# Patient Record
Sex: Female | Born: 1965 | Race: White | Hispanic: No | Marital: Single | Smoking: Current every day smoker
Health system: Southern US, Community
[De-identification: ages and names within clinical notes are randomized; demographics above are authoritative.]

## PROBLEM LIST (undated history)

## (undated) DIAGNOSIS — J40 Bronchitis, not specified as acute or chronic: Secondary | ICD-10-CM

## (undated) DIAGNOSIS — F314 Bipolar disorder, current episode depressed, severe, without psychotic features: Secondary | ICD-10-CM

## (undated) DIAGNOSIS — Z87442 Personal history of urinary calculi: Secondary | ICD-10-CM

## (undated) DIAGNOSIS — M51369 Other intervertebral disc degeneration, lumbar region without mention of lumbar back pain or lower extremity pain: Secondary | ICD-10-CM

## (undated) DIAGNOSIS — K219 Gastro-esophageal reflux disease without esophagitis: Secondary | ICD-10-CM

## (undated) DIAGNOSIS — I10 Essential (primary) hypertension: Secondary | ICD-10-CM

## (undated) DIAGNOSIS — M5136 Other intervertebral disc degeneration, lumbar region: Secondary | ICD-10-CM

## (undated) DIAGNOSIS — K589 Irritable bowel syndrome without diarrhea: Secondary | ICD-10-CM

## (undated) DIAGNOSIS — R0600 Dyspnea, unspecified: Secondary | ICD-10-CM

## (undated) DIAGNOSIS — I059 Rheumatic mitral valve disease, unspecified: Secondary | ICD-10-CM

## (undated) DIAGNOSIS — F419 Anxiety disorder, unspecified: Secondary | ICD-10-CM

## (undated) HISTORY — PX: FRACTURE SURGERY: SHX138

## (undated) HISTORY — PX: APPENDECTOMY: SHX54

## (undated) HISTORY — PX: TUBAL LIGATION: SHX77

## (undated) HISTORY — PX: HYSTEROSCOPY: SHX211

---

## 2014-12-01 ENCOUNTER — Emergency Department: Admit: 2014-12-01 | Disposition: A | Discharge status: Home / Self Care | Payer: Self-pay | Admitting: Internal Medicine

## 2014-12-25 ENCOUNTER — Encounter: Payer: Self-pay | Admitting: Emergency Medicine

## 2014-12-25 ENCOUNTER — Emergency Department
Admission: EM | Admit: 2014-12-25 | Discharge: 2014-12-25 | Disposition: A | Discharge status: Home / Self Care | Payer: Self-pay | Attending: Internal Medicine | Admitting: Internal Medicine

## 2014-12-25 ENCOUNTER — Emergency Department: Payer: Self-pay

## 2014-12-25 DIAGNOSIS — Z880 Allergy status to penicillin: Secondary | ICD-10-CM | POA: Insufficient documentation

## 2014-12-25 DIAGNOSIS — Z720 Tobacco use: Secondary | ICD-10-CM | POA: Insufficient documentation

## 2014-12-25 DIAGNOSIS — J040 Acute laryngitis: Secondary | ICD-10-CM | POA: Insufficient documentation

## 2014-12-25 DIAGNOSIS — B349 Viral infection, unspecified: Secondary | ICD-10-CM | POA: Insufficient documentation

## 2014-12-25 HISTORY — DX: Rheumatic mitral valve disease, unspecified: I05.9

## 2014-12-25 LAB — POCT RAPID STREP A: Streptococcus, Group A Screen (Direct): NEGATIVE

## 2014-12-25 MED ORDER — PSEUDOEPH-BROMPHEN-DM 30-2-10 MG/5ML PO SYRP: 5.0000 mL | ORAL_SOLUTION | Freq: Four times a day (QID) | ORAL | Status: DC | PRN

## 2014-12-25 MED ORDER — PREDNISONE 10 MG (21) PO TBPK: 10.0000 mg | ORAL_TABLET | Freq: Every day | ORAL | Status: DC

## 2014-12-25 MED ORDER — TRAMADOL HCL 50 MG PO TABS
50.0000 mg | ORAL_TABLET | Freq: Once | ORAL | Status: AC
  Administered 2014-12-25: 50 mg via ORAL

## 2014-12-25 MED ORDER — TRAMADOL HCL 50 MG PO TABS
ORAL_TABLET | ORAL | Status: AC
  Administered 2014-12-25: 50 mg via ORAL
  Filled 2014-12-25: qty 1

## 2014-12-25 MED ORDER — PROMETHAZINE-CODEINE 6.25-10 MG/5ML PO SYRP: 5.0000 mL | ORAL_SOLUTION | Freq: Four times a day (QID) | ORAL | Status: DC | PRN

## 2014-12-25 NOTE — Discharge Instructions (Signed)
Cough, Adult   A cough is a reflex. It helps you clear your throat and airways. A cough can help heal your body. A cough can last 2 or 3 weeks (acute) or may last more than 8 weeks (chronic). Some common causes of a cough can include an infection, allergy, or a cold.  HOME CARE  · Only take medicine as told by your doctor.  · If given, take your medicines (antibiotics) as told. Finish them even if you start to feel better.  · Use a cold steam vaporizer or humidifier in your home. This can help loosen thick spit (secretions).  · Sleep so you are almost sitting up (semi-upright). Use pillows to do this. This helps reduce coughing.  · Rest as needed.  · Stop smoking if you smoke.  GET HELP RIGHT AWAY IF:  · You have yellowish-white fluid (pus) in your thick spit.  · Your cough gets worse.  · Your medicine does not reduce coughing, and you are losing sleep.  · You cough up blood.  · You have trouble breathing.  · Your pain gets worse and medicine does not help.  · You have a fever.  MAKE SURE YOU:   · Understand these instructions.  · Will watch your condition.  · Will get help right away if you are not doing well or get worse.  Document Released: 04/15/2011 Document Revised: 12/17/2013 Document Reviewed: 04/15/2011  Exit[employer]® Patient Information ©2015 [employer]. This information is not intended to replace advice given to you by your health care provider. Make sure you discuss any questions you have with your health care provider.

## 2014-12-25 NOTE — ED Notes (Signed)
Sore throat X 3 days; congestion, nonproductive cough and headache. Alert and oriented X4, pt in NAD, RR even and unlabored, color WNL.

## 2014-12-25 NOTE — ED Notes (Signed)
Pt alert and oriented X4, active, cooperative, pt in NAD. RR even and unlabored, color WNL.    

## 2014-12-25 NOTE — ED Notes (Signed)
Sore throat, cough ans hoarse voice.  No resp distress

## 2014-12-25 NOTE — ED Provider Notes (Signed)
[facility] Emergency Department Provider Note  ____________________________________________  Time seen: Approximately 3:46 PM  I have reviewed the triage vital signs and the nursing notes.   HISTORY  Chief Complaint Sore Throat    HPI Robin Conway is a 49 y.o. female planning of 3 days of sore throat nonproductive cough facial and chest congestion. Patient denies any fever but states she has chills.  Patient also states there is decreased forced volume.Patient rates the pain as a 6/10 days increasing with coughing. Patient denies any nausea vomiting diarrhea.   Past Medical History  Diagnosis Date  . Mitral valve disorder     There are no active problems to display for this patient.   History reviewed. No pertinent past surgical history.  Current Outpatient Rx  Name  Route  Sig  Dispense  Refill  . brompheniramine-pseudoephedrine-DM 30-2-10 MG/5ML syrup   Oral   Take 5 mLs by mouth 4 (four) times daily as needed.   120 mL   0   . predniSONE (STERAPRED UNI-PAK 21 TAB) 10 MG (21) TBPK tablet   Oral   Take 1 tablet (10 mg total) by mouth daily. Taper dose for 6 days.   21 tablet   0     Allergies Doxycycline; Propoxyphene; Sulfa antibiotics; and Penicillins  No family history on file.  Social History History  Substance Use Topics  . Smoking status: Current Every Day Smoker  . Smokeless tobacco: Not on file  . Alcohol Use: No    Review of Systems Constitutional: Patient denies fever but has chills Eyes: No visual changes. ENT: Patient states sore throat and decreased forced volume Cardiovascular: Chest pain secondary to cough. Respiratory: Denies shortness of breath. Gastrointestinal: No abdominal pain.  No nausea, no vomiting.  No diarrhea.  No constipation. Genitourinary: Negative for dysuria. Musculoskeletal: Negative for back pain. Skin: Negative for rash. Neurological: Negative for headaches, focal weakness or  numbness. Psychiatric: Endocrine: Hematological/Lymphatic: Allergic/Immunilogical:  10-point ROS otherwise negative.  ____________________________________________   PHYSICAL EXAM:  VITAL SIGNS: ED Triage Vitals  Enc Vitals Group     BP 12/25/14 1505 139/82 mmHg     Pulse Rate 12/25/14 1505 108     Resp 12/25/14 1505 22     Temp 12/25/14 1505 98.4 F (36.9 C)     Temp Source 12/25/14 1505 Oral     SpO2 12/25/14 1505 96 %     Weight 12/25/14 1505 203 lb (92.08 kg)     Height 12/25/14 1505 5\' 4"  (1.626 m)     Head Cir --      Peak Flow --      Pain Score 12/25/14 1505 6     Pain Loc --      Pain Edu? --      Excl. in GC? --     Constitutional: Alert and oriented. Well appearing and in no acute distress. Eyes: Conjunctivae are normal. PERRL. EOMI. Head: Atraumatic. Nose: Edematous nasal turbinates with thick nasal discharge. Mouth/Throat: Mucous membranes are moist.  Postnasal drainage and erythematous pharynx. Decreased forced volume. Neck: No stridor.   Hematological/Lymphatic/Immunilogical: No cervical lymphadenopathy. Cardiovascular: Normal rate, regular rhythm. Grossly normal heart sounds.  Good peripheral circulation. Respiratory: Normal respiratory effort.  No retractions. Lungs with upper lobe Rales Gastrointestinal: Soft and nontender. No distention. No abdominal bruits. No CVA tenderness. Genitourinary: Not examined Musculoskeletal: No lower extremity tenderness nor edema.  No joint effusions. Neurologic:  Normal speech and language. No gross focal neurologic deficits are  appreciated. Speech is normal. No gait instability. Skin:  Skin is warm, dry and intact. No rash noted. Psychiatric: Mood and affect are normal. Speech and behavior are normal.  ____________________________________________   LABS (all labs ordered are listed, but only abnormal results are displayed)  Labs Reviewed  POCT RAPID STREP A ([facility]RG CARE ONLY)    ____________________________________________  EKG   ____________________________________________  RADIOLOGY  No acute findings. ____________________________________________   PROCEDURES  Procedure(s) performed: None  Critical Care performed: No  ____________________________________________   INITIAL IMPRESSION / ASSESSMENT AND PLAN / ED COURSE  Pertinent labs & imaging results that were available during my care of the patient were reviewed by me and considered in my medical decision making (see chart for details).  Viral  illness ____________________________________________   FINAL CLINICAL IMPRESSION(S) / ED DIAGNOSES  Final diagnoses:  Viral illness  Acute laryngitis      Joni ReiningRonald K Smith, PA-C 12/25/14 1645  Sheryl Edwina BarthL Gottlieb, DO 12/25/14 2227

## 2014-12-25 NOTE — ED Notes (Signed)
Pt informed to return if any life threatening symptoms occur.  Pt left with family member who is driving her.

## 2014-12-28 LAB — CULTURE, GROUP A STREP (THRC)

## 2015-02-18 ENCOUNTER — Emergency Department: Payer: Self-pay

## 2015-02-18 ENCOUNTER — Emergency Department
Admission: EM | Admit: 2015-02-18 | Discharge: 2015-02-18 | Disposition: A | Discharge status: Home / Self Care | Payer: Self-pay | Attending: Emergency Medicine | Admitting: Emergency Medicine

## 2015-02-18 ENCOUNTER — Encounter: Payer: Self-pay | Admitting: Emergency Medicine

## 2015-02-18 DIAGNOSIS — Y998 Other external cause status: Secondary | ICD-10-CM | POA: Insufficient documentation

## 2015-02-18 DIAGNOSIS — M545 Low back pain, unspecified: Secondary | ICD-10-CM

## 2015-02-18 DIAGNOSIS — Y9389 Activity, other specified: Secondary | ICD-10-CM | POA: Insufficient documentation

## 2015-02-18 DIAGNOSIS — Z7952 Long term (current) use of systemic steroids: Secondary | ICD-10-CM | POA: Insufficient documentation

## 2015-02-18 DIAGNOSIS — Y9289 Other specified places as the place of occurrence of the external cause: Secondary | ICD-10-CM | POA: Insufficient documentation

## 2015-02-18 DIAGNOSIS — Z79899 Other long term (current) drug therapy: Secondary | ICD-10-CM | POA: Insufficient documentation

## 2015-02-18 DIAGNOSIS — M5137 Other intervertebral disc degeneration, lumbosacral region: Secondary | ICD-10-CM | POA: Insufficient documentation

## 2015-02-18 DIAGNOSIS — M47817 Spondylosis without myelopathy or radiculopathy, lumbosacral region: Secondary | ICD-10-CM

## 2015-02-18 DIAGNOSIS — W06XXXA Fall from bed, initial encounter: Secondary | ICD-10-CM | POA: Insufficient documentation

## 2015-02-18 DIAGNOSIS — Z720 Tobacco use: Secondary | ICD-10-CM | POA: Insufficient documentation

## 2015-02-18 DIAGNOSIS — Z880 Allergy status to penicillin: Secondary | ICD-10-CM | POA: Insufficient documentation

## 2015-02-18 MED ORDER — ORPHENADRINE CITRATE 30 MG/ML IJ SOLN
60.0000 mg | Freq: Two times a day (BID) | INTRAMUSCULAR | Status: DC
  Administered 2015-02-18: 60 mg via INTRAMUSCULAR

## 2015-02-18 MED ORDER — HYDROMORPHONE HCL 1 MG/ML IJ SOLN
1.0000 mg | Freq: Once | INTRAMUSCULAR | Status: AC
  Administered 2015-02-18: 1 mg via INTRAMUSCULAR

## 2015-02-18 MED ORDER — OXYCODONE-ACETAMINOPHEN 7.5-325 MG PO TABS: 1.0000 | ORAL_TABLET | Freq: Four times a day (QID) | ORAL | Status: DC | PRN

## 2015-02-18 MED ORDER — ORPHENADRINE CITRATE 30 MG/ML IJ SOLN
INTRAMUSCULAR | Status: AC
  Filled 2015-02-18: qty 2

## 2015-02-18 MED ORDER — HYDROMORPHONE HCL 1 MG/ML IJ SOLN
INTRAMUSCULAR | Status: AC
  Filled 2015-02-18: qty 1

## 2015-02-18 MED ORDER — METHOCARBAMOL 750 MG PO TABS: 1500.0000 mg | ORAL_TABLET | Freq: Four times a day (QID) | ORAL | Status: DC

## 2015-02-18 NOTE — ED Notes (Signed)
Patient states she was sharing twin bed with her niece. Larey SeatFell out of bed. Now having pain at lower back in middle. Injury happened on Saturday.

## 2015-02-18 NOTE — ED Provider Notes (Signed)
[facility] Emergency Department Provider Note  ____________________________________________  Time seen: Approximately 2:01 PM  I have reviewed the triage vital signs and the nursing notes.   HISTORY  Chief Complaint Fall and Back Pain    HPI Robin Conway is a 49 y.o. female patient complaining of low back pain secondary to a fall from a bed. Patient states she was certain at 20 bed for her knees and she rolled and fell out of Reglan on her back and also hit the back of her head. Patient denies any loss of consciousness is no vertical or vision disturbance since this incident 2 days ago. Patient stated back pain is increased and is and affect in her walking sitting and standing. Patient denies any radicular component to her back pain she denies any bladder or bowel dysfunction. Patient is rating the pain as a 10/70 describe the sharp.   Past Medical History  Diagnosis Date  . Mitral valve disorder     There are no active problems to display for this patient.   Past Surgical History  Procedure Laterality Date  . Tubal ligation      Current Outpatient Rx  Name  Route  Sig  Dispense  Refill  . methocarbamol (ROBAXIN-750) 750 MG tablet   Oral   Take 2 tablets (1,500 mg total) by mouth 4 (four) times daily.   40 tablet   0   . oxyCODONE-acetaminophen (PERCOCET) 7.5-325 MG per tablet   Oral   Take 1 tablet by mouth every 6 (six) hours as needed for severe pain.   12 tablet   0   . predniSONE (STERAPRED UNI-PAK 21 TAB) 10 MG (21) TBPK tablet   Oral   Take 1 tablet (10 mg total) by mouth daily. Taper dose for 6 days.   21 tablet   0   . promethazine-codeine (PHENERGAN WITH CODEINE) 6.25-10 MG/5ML syrup   Oral   Take 5 mLs by mouth every 6 (six) hours as needed for cough.   120 mL   0     Allergies Doxycycline; Propoxyphene; Sulfa antibiotics; and Penicillins  No family history on file.  Social History History  Substance Use Topics   . Smoking status: Current Every Day Smoker  . Smokeless tobacco: Not on file  . Alcohol Use: No    Review of Systems Constitutional: No fever/chills Eyes: No visual changes. ENT: No sore throat. Cardiovascular: Denies chest pain. Respiratory: Denies shortness of breath. Gastrointestinal: No abdominal pain.  No nausea, no vomiting.  No diarrhea.  No constipation. Genitourinary: Negative for dysuria. Musculoskeletal: Positive for back pain. Skin: Negative for rash. Neurological: Negative for headaches, focal weakness or numbness. Allergic/Immunilogical: See medication list 10-point ROS otherwise negative.  ____________________________________________   PHYSICAL EXAM:  VITAL SIGNS: ED Triage Vitals  Enc Vitals Group     BP 02/18/15 1311 152/88 mmHg     Pulse Rate 02/18/15 1311 102     Resp 02/18/15 1311 18     Temp 02/18/15 1311 98 F (36.7 C)     Temp Source 02/18/15 1311 Oral     SpO2 02/18/15 1311 96 %     Weight 02/18/15 1311 220 lb (99.791 kg)     Height 02/18/15 1311 5\' 4"  (1.626 m)     Head Cir --      Peak Flow --      Pain Score 02/18/15 1332 7     Pain Loc --      Pain Edu? --  Excl. in GC? --     Constitutional: Alert and oriented. Well appearing and in no acute distress. Eyes: Conjunctivae are normal. PERRL. EOMI. Head: Atraumatic. Healed scalp abrasion. Nose: No congestion/rhinnorhea. Mouth/Throat: Mucous membranes are moist.  Oropharynx non-erythematous. Neck: No stridor.  No cervical spine tenderness to palpation. Hematological/Lymphatic/Immunilogical: No cervical lymphadenopathy. Cardiovascular: Normal rate, regular rhythm. Grossly normal heart sounds.  Good peripheral circulation. Respiratory: Normal respiratory effort.  No retractions. Lungs CTAB. Gastrointestinal: Soft and nontender. No distention. No abdominal bruits. No CVA tenderness. Musculoskeletal: Obvious spinal deformity decreased range of motion in all fields patient sits to stand  her last upper extremity there is a small ecchymosis noticed inferior lumbar area Neurologic:  Normal speech and language. No gross focal neurologic deficits are appreciated. Speech is normal. No gait instability. Skin:  Skin is warm, dry and intact. No rash noted. Ecchymosis left buttocks. Psychiatric: Mood and affect are normal. Speech and behavior are normal.  ____________________________________________   LABS (all labs ordered are listed, but only abnormal results are displayed)  Labs Reviewed - No data to display ____________________________________________  EKG   ____________________________________________  RADIOLOGY arthritic changes but no acute findings.Marland KitchenMarland KitchenI, Joni Reining, personally viewed and evaluated these images as part of my medical decision making.   ____________________________________________   PROCEDURES  Procedure(s) performed: None  Critical Care performed: No  ____________________________________________   INITIAL IMPRESSION / ASSESSMENT AND PLAN / ED COURSE  Pertinent labs & imaging results that were available during my care of the patient were reviewed by me and considered in my medical decision making (see chart for details).  Low back pain secondary to fall. Discuss x-ray results with patient. He is to follow-up family doctor in 3-5 days if pain continues. Patient prescribed tramadol and Robaxin take as directed. ____________________________________________   FINAL CLINICAL IMPRESSION(S) / ED DIAGNOSES  Final diagnoses:  Back pain at L4-L5 level  DJD (degenerative joint disease), lumbosacral      Joni Reining, PA-C 02/18/15 1544  Loleta Rose, MD 02/22/15 1458

## 2015-03-22 ENCOUNTER — Emergency Department
Admission: EM | Admit: 2015-03-22 | Discharge: 2015-03-22 | Disposition: A | Discharge status: Home / Self Care | Payer: Self-pay | Attending: Emergency Medicine | Admitting: Emergency Medicine

## 2015-03-22 ENCOUNTER — Encounter: Payer: Self-pay | Admitting: Emergency Medicine

## 2015-03-22 DIAGNOSIS — Y9389 Activity, other specified: Secondary | ICD-10-CM | POA: Insufficient documentation

## 2015-03-22 DIAGNOSIS — Y998 Other external cause status: Secondary | ICD-10-CM | POA: Insufficient documentation

## 2015-03-22 DIAGNOSIS — Y9289 Other specified places as the place of occurrence of the external cause: Secondary | ICD-10-CM | POA: Insufficient documentation

## 2015-03-22 DIAGNOSIS — W1839XA Other fall on same level, initial encounter: Secondary | ICD-10-CM | POA: Insufficient documentation

## 2015-03-22 DIAGNOSIS — Z880 Allergy status to penicillin: Secondary | ICD-10-CM | POA: Insufficient documentation

## 2015-03-22 DIAGNOSIS — Z720 Tobacco use: Secondary | ICD-10-CM | POA: Insufficient documentation

## 2015-03-22 DIAGNOSIS — M5442 Lumbago with sciatica, left side: Secondary | ICD-10-CM | POA: Insufficient documentation

## 2015-03-22 DIAGNOSIS — Z7952 Long term (current) use of systemic steroids: Secondary | ICD-10-CM | POA: Insufficient documentation

## 2015-03-22 HISTORY — DX: Other intervertebral disc degeneration, lumbar region without mention of lumbar back pain or lower extremity pain: M51.369

## 2015-03-22 HISTORY — DX: Other intervertebral disc degeneration, lumbar region: M51.36

## 2015-03-22 MED ORDER — OXYCODONE-ACETAMINOPHEN 5-325 MG PO TABS: 1.0000 | ORAL_TABLET | Freq: Four times a day (QID) | ORAL | Status: DC | PRN

## 2015-03-22 MED ORDER — HYDROMORPHONE HCL 1 MG/ML IJ SOLN
1.0000 mg | Freq: Once | INTRAMUSCULAR | Status: AC
  Administered 2015-03-22: 1 mg via INTRAMUSCULAR
  Filled 2015-03-22: qty 1

## 2015-03-22 MED ORDER — PREDNISONE 10 MG (21) PO TBPK: ORAL_TABLET | ORAL | Status: DC

## 2015-03-22 NOTE — ED Notes (Signed)
Robin Conway who states he is the pts brother in law and will give the pt a ride home after getting this medication.

## 2015-03-22 NOTE — ED Notes (Addendum)
Pt says she fell last night going up a ramp into someone's house; history of degenerative disc disease; increased pain since fall; lumbar pain; also mild headache since yesterday to frontal lobe; says she's sure she did not hit her head yesterday

## 2015-03-22 NOTE — Discharge Instructions (Signed)
Back Pain, Adult °Back pain is very common. The pain often gets better over time. The cause of back pain is usually not dangerous. Most people can learn to manage their back pain on their own.  °HOME CARE  °· Stay active. Start with short walks on flat ground if you can. Try to walk farther each day. °· Do not sit, drive, or stand in one place for more than 30 minutes. Do not stay in bed. °· Do not avoid exercise or work. Activity can help your back heal faster. °· Be careful when you bend or lift an object. Bend at your knees, keep the object close to you, and do not twist. °· Sleep on a firm mattress. Lie on your side, and bend your knees. If you lie on your back, put a pillow under your knees. °· Only take medicines as told by your doctor. °· Put ice on the injured area. °¨ Put ice in a plastic bag. °¨ Place a towel between your skin and the bag. °¨ Leave the ice on for 15-20 minutes, 03-04 times a day for the first 2 to 3 days. After that, you can switch between ice and heat packs. °· Ask your doctor about back exercises or massage. °· Avoid feeling anxious or stressed. Find good ways to deal with stress, such as exercise. °GET HELP RIGHT AWAY IF:  °· Your pain does not go away with rest or medicine. °· Your pain does not go away in 1 week. °· You have new problems. °· You do not feel well. °· The pain spreads into your legs. °· You cannot control when you poop (bowel movement) or pee (urinate). °· Your arms or legs feel weak or lose feeling (numbness). °· You feel sick to your stomach (nauseous) or throw up (vomit). °· You have belly (abdominal) pain. °· You feel like you may pass out (faint). °MAKE SURE YOU:  °· Understand these instructions. °· Will watch your condition. °· Will get help right away if you are not doing well or get worse. °Document Released: 01/19/2008 Document Revised: 10/25/2011 Document Reviewed: 12/04/2013 °[employer]® Patient Information ©2015 [employer]. This information is not intended  to replace advice given to you by your health care provider. Make sure you discuss any questions you have with your health care provider. ° °Back Exercises °Back exercises help treat and prevent back injuries. The goal is to increase your strength in your belly (abdominal) and back muscles. These exercises can also help with flexibility. Start these exercises when told by your doctor. °HOME CARE °Back exercises include: °Pelvic Tilt. °· Lie on your back with your knees bent. Tilt your pelvis until the lower part of your back is against the floor. Hold this position 5 to 10 sec. Repeat this exercise 5 to 10 times. °Knee to Chest. °· Pull 1 knee up against your chest and hold for 20 to 30 seconds. Repeat this with the other knee. This may be done with the other leg straight or bent, whichever feels better. Then, pull both knees up against your chest. °Sit-Ups or Curl-Ups. °· Bend your knees 90 degrees. Start with tilting your pelvis, and do a partial, slow sit-up. Only lift your upper half 30 to 45 degrees off the floor. Take at least 2 to 3 seonds for each sit-up. Do not do sit-ups with your knees out straight. If partial sit-ups are difficult, simply do the above but with only tightening your belly (abdominal) muscles and holding it as   told. Hip-Lift.  Lie on your back with your knees flexed 90 degrees. Push down with your feet and shoulders as you raise your hips 2 inches off the floor. Hold for 10 seconds, repeat 5 to 10 times. Back Arches.  Lie on your stomach. Prop yourself up on bent elbows. Slowly press on your hands, causing an arch in your low back. Repeat 3 to 5 times. Shoulder-Lifts.  Lie face down with arms beside your body. Keep hips and belly pressed to floor as you slowly lift your head and shoulders off the floor. Do not overdo your exercises. Be careful in the beginning. Exercises may cause you some mild back discomfort. If the pain lasts for more than 15 minutes, stop the exercises until  you see your doctor. Improvement with exercise for back problems is slow.  Document Released: 09/04/2010 Document Revised: 10/25/2011 Document Reviewed: 06/03/2011 [facility] Patient Information 2015 [place], [place]. This information is not intended to replace advice given to you by your health care provider. Make sure you discuss any questions you have with your health care provider.    Take pain medicine as directed. Follow-up with your physician or the orthopedist for further evaluation. When pain improves, begin exercises.

## 2015-03-22 NOTE — ED Notes (Signed)
Pt states she is here due to falling yesterday and states she tried heat and ice, motrin and nothing helps pt states she has degenerative disk disease and thinks the fall caused the flair up in pain.

## 2015-03-22 NOTE — ED Notes (Signed)
Patient to be driven home by Randol Kern.

## 2015-03-22 NOTE — ED Provider Notes (Signed)
[facility] Emergency Department Provider Note  ____________________________________________  Time seen: Approximately 9:43 PM  I have reviewed the triage vital signs and the nursing notes.   HISTORY  Chief Complaint Back Pain    HPI SOPHINA MITTEN is a 49 y.o. female with history of degenerative disc disease who fell yesterday morning, injuring her lower back. She has radiation down the left leg. She was seen for similar complaint one month ago. She has followed up with her physician, Edmonia James and has plans to see orthopedist. She does take Soma occasionally for pain. Pain is worse with movement. X-rays last month were stable. No abdominal pain. Occasional nausea when pain is severe.    Past Medical History  Diagnosis Date  . Mitral valve disorder   . Degenerative disc disease, lumbar     There are no active problems to display for this patient.   Past Surgical History  Procedure Laterality Date  . Tubal ligation      Current Outpatient Rx  Name  Route  Sig  Dispense  Refill  . methocarbamol (ROBAXIN-750) 750 MG tablet   Oral   Take 2 tablets (1,500 mg total) by mouth 4 (four) times daily.   40 tablet   0   . oxyCODONE-acetaminophen (ROXICET) 5-325 MG per tablet   Oral   Take 1 tablet by mouth every 6 (six) hours as needed.   20 tablet   0   . predniSONE (STERAPRED UNI-PAK 21 TAB) 10 MG (21) TBPK tablet      6 tablets on day 1, 5 tablets on day 2, 4 tablets on day 3, etc...   21 tablet   0   . promethazine-codeine (PHENERGAN WITH CODEINE) 6.25-10 MG/5ML syrup   Oral   Take 5 mLs by mouth every 6 (six) hours as needed for cough.   120 mL   0     Allergies Doxycycline; Propoxyphene; Sulfa antibiotics; and Penicillins  History reviewed. No pertinent family history.  Social History History  Substance Use Topics  . Smoking status: Current Every Day Smoker  . Smokeless tobacco: Not on file  . Alcohol Use: No    Review  of Systems Constitutional: No fever/chills Eyes: No visual changes. ENT: No sore throat. Cardiovascular: Denies chest pain. Respiratory: Denies shortness of breath. Gastrointestinal: No abdominal pain.  No diarrhea.  No constipation. Genitourinary: Negative for dysuria. Musculoskeletal: See above Skin: Negative for rash. Neurological: Negative for headaches, focal weakness or numbness.  10-point ROS otherwise negative.  ____________________________________________   PHYSICAL EXAM:  VITAL SIGNS: ED Triage Vitals  Enc Vitals Group     BP 03/22/15 1934 125/90 mmHg     Pulse Rate 03/22/15 1934 104     Resp 03/22/15 1934 20     Temp 03/22/15 1934 97.9 F (36.6 C)     Temp Source 03/22/15 1934 Oral     SpO2 --      Weight 03/22/15 1934 225 lb (102.059 kg)     Height 03/22/15 1934 5\' 4"  (1.626 m)     Head Cir --      Peak Flow --      Pain Score 03/22/15 1935 7     Pain Loc --      Pain Edu? --      Excl. in GC? --     Constitutional: Alert and oriented. Well appearing and in moderate acute distress. Eyes: Conjunctivae are normal. EOMI. Head: Atraumatic. Nose: No congestion/rhinnorhea. Mouth/Throat: Mucous membranes are  moist.  Oropharynx non-erythematous. Neck: No stridor.  Cardiovascular: Normal rate, regular rhythm. Grossly normal heart sounds.  Good peripheral circulation. Respiratory: Normal respiratory effort.  No retractions. Lungs CTAB. Gastrointestinal: Soft and nontender. No distention. No abdominal bruits. No CVA tenderness. Musculoskeletal:    tender over the lumbar spine and paraspinal muscles.  rom intact.  positive SLR on left.  Neg on right.   nontender over the greater trochanter bilateral.  Neurologic:  Normal speech and language. No gross focal neurologic deficits are appreciated. No gait instability. Skin:  Skin is warm, dry and intact. No rash noted. Psychiatric: Mood and affect are normal. Speech and behavior are  normal.  ____________________________________________   LABS (all labs ordered are listed, but only abnormal results are displayed)  Labs Reviewed - No data to display ____________________________________________  EKG   ____________________________________________  RADIOLOGY  Offered repeat xray but patient decline ____________________________________________   PROCEDURES  Procedure(s) performed: None  Critical Care performed: No  ____________________________________________   INITIAL IMPRESSION / ASSESSMENT AND PLAN / ED COURSE  Pertinent labs & imaging results that were available during my care of the patient were reviewed by me and considered in my medical decision making (see chart for details).  49 year old female with known history of degenerative disc disease who presents with new worsening lower back pain with radiation down the left leg, since a fall yesterday. She fell going up a ramp. Pain is localized to the lower back and radiates down the left leg. She did walk in here. Offered repeat x-rays but patient declines. She is given an injection for pain control. She will take prednisone taper, and Percocet as needed. She has follow-up with her physician, Gwinda Passe who has planned to refer to orthopedist. If needed she can contact Dr. Martha Clan for follow-up. ____________________________________________   FINAL CLINICAL IMPRESSION(S) / ED DIAGNOSES  Final diagnoses:  Midline low back pain with left-sided sciatica      Ignacia Bayley, PA-C 03/22/15 2150  Sharman Cheek, MD 03/22/15 [phone]

## 2015-05-12 ENCOUNTER — Emergency Department: Payer: Self-pay

## 2015-05-12 ENCOUNTER — Emergency Department
Admission: EM | Admit: 2015-05-12 | Discharge: 2015-05-12 | Disposition: A | Discharge status: Home / Self Care | Payer: Self-pay | Attending: Emergency Medicine | Admitting: Emergency Medicine

## 2015-05-12 ENCOUNTER — Encounter: Payer: Self-pay | Admitting: Emergency Medicine

## 2015-05-12 DIAGNOSIS — Z720 Tobacco use: Secondary | ICD-10-CM | POA: Insufficient documentation

## 2015-05-12 DIAGNOSIS — M545 Low back pain, unspecified: Secondary | ICD-10-CM

## 2015-05-12 DIAGNOSIS — Y998 Other external cause status: Secondary | ICD-10-CM | POA: Insufficient documentation

## 2015-05-12 DIAGNOSIS — G8929 Other chronic pain: Secondary | ICD-10-CM | POA: Insufficient documentation

## 2015-05-12 DIAGNOSIS — Z880 Allergy status to penicillin: Secondary | ICD-10-CM | POA: Insufficient documentation

## 2015-05-12 DIAGNOSIS — W1839XA Other fall on same level, initial encounter: Secondary | ICD-10-CM | POA: Insufficient documentation

## 2015-05-12 DIAGNOSIS — S3992XA Unspecified injury of lower back, initial encounter: Secondary | ICD-10-CM | POA: Insufficient documentation

## 2015-05-12 DIAGNOSIS — Y9389 Activity, other specified: Secondary | ICD-10-CM | POA: Insufficient documentation

## 2015-05-12 DIAGNOSIS — Y9289 Other specified places as the place of occurrence of the external cause: Secondary | ICD-10-CM | POA: Insufficient documentation

## 2015-05-12 DIAGNOSIS — M5136 Other intervertebral disc degeneration, lumbar region: Secondary | ICD-10-CM | POA: Insufficient documentation

## 2015-05-12 MED ORDER — ORPHENADRINE CITRATE 30 MG/ML IJ SOLN
60.0000 mg | Freq: Two times a day (BID) | INTRAMUSCULAR | Status: DC
  Administered 2015-05-12: 60 mg via INTRAMUSCULAR
  Filled 2015-05-12: qty 2

## 2015-05-12 MED ORDER — HYDROMORPHONE HCL 1 MG/ML IJ SOLN
1.0000 mg | Freq: Once | INTRAMUSCULAR | Status: AC
  Administered 2015-05-12: 1 mg via INTRAMUSCULAR
  Filled 2015-05-12: qty 1

## 2015-05-12 MED ORDER — KETOROLAC TROMETHAMINE 60 MG/2ML IM SOLN
60.0000 mg | Freq: Once | INTRAMUSCULAR | Status: AC
  Administered 2015-05-12: 60 mg via INTRAMUSCULAR
  Filled 2015-05-12: qty 2

## 2015-05-12 NOTE — ED Provider Notes (Signed)
[facility] Emergency Department Provider Note  ____________________________________________  Time seen: Approximately 2:46 PM  I have reviewed the triage vital signs and the nursing notes.   HISTORY  Chief Complaint Back Pain    HPI Robin Conway is a 49 y.o. female is complaining of low back pain secondary to a fall. Patient fell backwards landing on her buttocks and then fell on her right hip. 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 Patient denies any bladder or bowel dysfunction. Patient denies any radicular component to this pain. No palliative measures taken for this complaint. Patient rated her pain as 8/10 described as sharp.  Past Medical History  Diagnosis Date  . Mitral valve disorder   . Degenerative disc disease, lumbar     There are no active problems to display for this patient.   Past Surgical History  Procedure Laterality Date  . Tubal ligation      Current Outpatient Rx  Name  Route  Sig  Dispense  Refill  . methocarbamol (ROBAXIN-750) 750 MG tablet   Oral   Take 2 tablets (1,500 mg total) by mouth 4 (four) times daily.   40 tablet   0   . oxyCODONE-acetaminophen (ROXICET) 5-325 MG per tablet   Oral   Take 1 tablet by mouth every 6 (six) hours as needed.   20 tablet   0   . predniSONE (STERAPRED UNI-PAK 21 TAB) 10 MG (21) TBPK tablet      6 tablets on day 1, 5 tablets on day 2, 4 tablets on day 3, etc...   21 tablet   0   . promethazine-codeine (PHENERGAN WITH CODEINE) 6.25-10 MG/5ML syrup   Oral   Take 5 mLs by mouth every 6 (six) hours as needed for cough.   120 mL   0     Allergies Doxycycline; Propoxyphene; Sulfa antibiotics; and Penicillins  No family history on file.  Social History Social History  Substance Use Topics  . Smoking status: Current Every Day Smoker  . Smokeless tobacco: None  . Alcohol Use: No    Review of Systems Constitutional: No  fever/chills Eyes: No visual changes. ENT: No sore throat. Cardiovascular: Denies chest pain. Respiratory: Denies shortness of breath. Gastrointestinal: No abdominal pain.  No nausea, no vomiting.  No diarrhea.  No constipation. Genitourinary: Negative for dysuria. Musculoskeletal: Chronic back pain  Skin: Negative for rash. Neurological: Negative for headaches, focal weakness or numbness. Allergic/Immunilogical: See medication list  10-point ROS otherwise negative.  ____________________________________________   PHYSICAL EXAM:  VITAL SIGNS: ED Triage Vitals  Enc Vitals Group     BP --      Pulse --      Resp --      Temp --      Temp src --      SpO2 --      Weight 05/12/15 1444 200 lb (90.719 kg)     Height 05/12/15 1444  (1.626 m)     Head Cir --      Peak Flow --      Pain Score 05/12/15 1444 8     Pain Loc --      Pain Edu? --      Excl. in GC? --     Constitutional: Alert and oriented. Well appearing and in no acute distress. Eyes: Conjunctivae are normal. PERRL. EOMI. Head: Atraumatic. Nose: No congestion/rhinnorhea. Mouth/Throat: Mucous membranes are moist.  Oropharynx non-erythematous.  Neck: No stridor.   Hematological/Lymphatic/Immunilogical: No cervical lymphadenopathy. Cardiovascular: Normal rate, regular rhythm. Grossly normal heart sounds.  Good peripheral circulation. Respiratory: Normal respiratory effort.  No retractions. Lungs CTAB. Gastrointestinal: Soft and nontender. No distention. No abdominal bruits. No CVA tenderness. Musculoskeletal: No lower extremity tenderness nor edema.  No joint effusions. Neurologic:  Normal speech and language. No gross focal neurologic deficits are appreciated. No gait instability. Skin:  Skin is warm, dry and intact. No rash noted. Psychiatric: Mood and affect are normal. Speech and behavior are normal.  ____________________________________________   LABS (all labs ordered are listed, but only abnormal  results are displayed)  Labs Reviewed - No data to display ____________________________________________  EKG   ____________________________________________  RADIOLOGY  No acute findings on lumbar x-ray. Dedicated facet changes at L5-S1 as noticed on previous exam.  I, Joni Reining, personally viewed and evaluated these images (plain radiographs) as part of my medical decision making.   ____________________________________________   PROCEDURES  Procedure(s) performed: None  Critical Care performed: No  ____________________________________________   INITIAL IMPRESSION / ASSESSMENT AND PLAN / ED COURSE  Pertinent labs & imaging results that were available during my care of the patient were reviewed by me and considered in my medical decision making (see chart for details). Acute low back pain. Discussed x-ray findings with patient. Patient given prescription for Mobic and tramadol. Patient advised to wear elastic lumbar support while at work. Patient advised [facility]. ____________________________________________   FINAL CLINICAL IMPRESSION(S) / ED DIAGNOSES  Final diagnoses:  Back pain at L4-L5 level      Joni Reining, PA-C 05/12/15 1606  Emily Filbert, MD 05/13/15 [phone]

## 2015-05-12 NOTE — ED Notes (Signed)
States she fell backwards today  Having lower back pain

## 2015-05-12 NOTE — Discharge Instructions (Signed)
Advised elastic lumbar support

## 2015-05-12 NOTE — ED Notes (Signed)
States she fell backwards this am   Having severe back pain  Ambulates  Slowly d/t pain

## 2015-07-11 ENCOUNTER — Emergency Department: Payer: Self-pay

## 2015-07-11 ENCOUNTER — Emergency Department
Admission: EM | Admit: 2015-07-11 | Discharge: 2015-07-11 | Disposition: A | Discharge status: Home / Self Care | Payer: Self-pay | Attending: Emergency Medicine | Admitting: Emergency Medicine

## 2015-07-11 DIAGNOSIS — I10 Essential (primary) hypertension: Secondary | ICD-10-CM | POA: Insufficient documentation

## 2015-07-11 DIAGNOSIS — F17210 Nicotine dependence, cigarettes, uncomplicated: Secondary | ICD-10-CM | POA: Insufficient documentation

## 2015-07-11 DIAGNOSIS — J40 Bronchitis, not specified as acute or chronic: Secondary | ICD-10-CM | POA: Insufficient documentation

## 2015-07-11 DIAGNOSIS — Z880 Allergy status to penicillin: Secondary | ICD-10-CM | POA: Insufficient documentation

## 2015-07-11 DIAGNOSIS — E119 Type 2 diabetes mellitus without complications: Secondary | ICD-10-CM | POA: Insufficient documentation

## 2015-07-11 HISTORY — DX: Bipolar disorder, current episode depressed, severe, without psychotic features: F31.4

## 2015-07-11 HISTORY — DX: Essential (primary) hypertension: I10

## 2015-07-11 LAB — POCT RAPID STREP A: Streptococcus, Group A Screen (Direct): NEGATIVE

## 2015-07-11 MED ORDER — PREDNISONE 20 MG PO TABS
60.0000 mg | ORAL_TABLET | Freq: Once | ORAL | Status: AC
  Administered 2015-07-11: 60 mg via ORAL
  Filled 2015-07-11: qty 3

## 2015-07-11 MED ORDER — HYDROCOD POLST-CPM POLST ER 10-8 MG/5ML PO SUER: 5.0000 mL | Freq: Two times a day (BID) | ORAL | Status: DC

## 2015-07-11 MED ORDER — PREDNISONE 10 MG (21) PO TBPK: 10.0000 mg | ORAL_TABLET | Freq: Every day | ORAL | Status: DC

## 2015-07-11 MED ORDER — IPRATROPIUM-ALBUTEROL 0.5-2.5 (3) MG/3ML IN SOLN
3.0000 mL | Freq: Once | RESPIRATORY_TRACT | Status: AC
  Administered 2015-07-11: 3 mL via RESPIRATORY_TRACT
  Filled 2015-07-11: qty 3

## 2015-07-11 MED ORDER — ALBUTEROL SULFATE HFA 108 (90 BASE) MCG/ACT IN AERS: 2.0000 | INHALATION_SPRAY | Freq: Four times a day (QID) | RESPIRATORY_TRACT | Status: DC | PRN

## 2015-07-11 NOTE — Discharge Instructions (Signed)

## 2015-07-11 NOTE — ED Notes (Signed)
Pt c/o cough with congestion for the past 3-4 days.. States she has been taking OTC meds without relief.

## 2015-07-11 NOTE — ED Provider Notes (Signed)
[facility] Emergency Department Provider Note     Time seen: ----------------------------------------- 2:07 PM on 07/11/2015 -----------------------------------------    I have reviewed the triage vital signs and the nursing notes.   HISTORY  Chief Complaint URI    HPI Robin Conway is a 49 y.o. female who presents ER with cough congestion for 3-4 days. Patient states been taking over-the-counter medications without any relief.Patient complains of persistent productive cough, mild sore throat and weakness. Patient does not have history of asthma or COPD.   Past Medical History  Diagnosis Date  . Mitral valve disorder   . Degenerative disc disease, lumbar   . Hypertension   . Diabetes mellitus without complication (HCC)   . Bipolar 1 disorder, depressed, severe (HCC)     There are no active problems to display for this patient.   Past Surgical History  Procedure Laterality Date  . Tubal ligation    . Fracture surgery      Allergies Doxycycline; Propoxyphene; Sulfa antibiotics; and Penicillins  Social History Social History  Substance Use Topics  . Smoking status: Current Every Day Smoker    Types: Cigarettes  . Smokeless tobacco: None  . Alcohol Use: No    Review of Systems Constitutional: Negative for fever. Eyes: Negative for visual changes. ENT: Negative for sore throat. Cardiovascular: Negative for chest pain. Respiratory: Positive for shortness of breath and cough Gastrointestinal: Negative for abdominal pain, vomiting and diarrhea. Genitourinary: Negative for dysuria. Musculoskeletal: Negative for back pain. Skin: Negative for rash. Neurological: Negative for headaches, focal weakness or numbness.  10-point ROS otherwise negative.  ____________________________________________   PHYSICAL EXAM:  VITAL SIGNS: ED Triage Vitals  Enc Vitals Group     BP 07/11/15 1351 105/74 mmHg     Pulse Rate 07/11/15 1351 102   Resp 07/11/15 1351 20     Temp 07/11/15 1351 97.6 F (36.4 C)     Temp Source 07/11/15 1351 Oral     SpO2 07/11/15 1351 96 %     Weight 07/11/15 1351 223 lb (101.152 kg)     Height 07/11/15 1351  (1.6 m)     Head Cir --      Peak Flow --      Pain Score 07/11/15 1351 6     Pain Loc --      Pain Edu? --      Excl. in GC? --     Constitutional: Alert and oriented. Well appearing and in no distress. Eyes: Conjunctivae are normal. PERRL. Normal extraocular movements. ENT   Head: Normocephalic and atraumatic.   Nose: No congestion/rhinnorhea.   Mouth/Throat: Mucous membranes are moist.   Neck: No stridor. Cardiovascular: Normal rate, regular rhythm. Normal and symmetric distal pulses are present in all extremities. No murmurs, rubs, or gallops. Respiratory: Normal respiratory effort without tachypnea nor retractions. Patient is wheezing with Bilateral rhonchi Gastrointestinal: Soft and nontender. No distention. No abdominal bruits.  Musculoskeletal: Nontender with normal range of motion in all extremities. No joint effusions.  No lower extremity tenderness nor edema. Neurologic:  Normal speech and language. No gross focal neurologic deficits are appreciated. Speech is normal. No gait instability. Skin:  Skin is warm, dry and intact. No rash noted. Psychiatric: Mood and affect are normal. Speech and behavior are normal. Patient exhibits appropriate insight and judgment. ____________________________________________  ED COURSE:  Pertinent labs & imaging results that were available during my care of the patient were reviewed by me and considered in my medical  decision making (see chart for details). Patient was wheezing, she'll receive DuoNeb and prednisone. ____________________________________________   RADIOLOGY Images were viewed by me  Chest x-ray IMPRESSION: No acute cardiopulmonary disease. ____________________________________________  FINAL ASSESSMENT AND  PLAN  Bronchitis  Plan: Patient with labs and imaging as dictated above. Patient is in no acute distress, received DuoNeb since steroids. She'll be discharged with same, likely diagnosis is bronchitis stone.   Emily FilbertWilliams, Jerrico Covello E, MD   Emily FilbertJonathan E Spiro Ausborn, MD 07/11/15 [phone]

## 2015-07-15 ENCOUNTER — Emergency Department
Admission: EM | Admit: 2015-07-15 | Discharge: 2015-07-15 | Disposition: A | Discharge status: Home / Self Care | Payer: Self-pay | Attending: Emergency Medicine | Admitting: Emergency Medicine

## 2015-07-15 ENCOUNTER — Encounter: Payer: Self-pay | Admitting: Emergency Medicine

## 2015-07-15 DIAGNOSIS — I10 Essential (primary) hypertension: Secondary | ICD-10-CM | POA: Insufficient documentation

## 2015-07-15 DIAGNOSIS — Z79899 Other long term (current) drug therapy: Secondary | ICD-10-CM | POA: Insufficient documentation

## 2015-07-15 DIAGNOSIS — Z880 Allergy status to penicillin: Secondary | ICD-10-CM | POA: Insufficient documentation

## 2015-07-15 DIAGNOSIS — F17210 Nicotine dependence, cigarettes, uncomplicated: Secondary | ICD-10-CM | POA: Insufficient documentation

## 2015-07-15 DIAGNOSIS — J209 Acute bronchitis, unspecified: Secondary | ICD-10-CM | POA: Insufficient documentation

## 2015-07-15 DIAGNOSIS — E119 Type 2 diabetes mellitus without complications: Secondary | ICD-10-CM | POA: Insufficient documentation

## 2015-07-15 DIAGNOSIS — M549 Dorsalgia, unspecified: Secondary | ICD-10-CM | POA: Insufficient documentation

## 2015-07-15 DIAGNOSIS — J40 Bronchitis, not specified as acute or chronic: Secondary | ICD-10-CM

## 2015-07-15 MED ORDER — HYDROCODONE-ACETAMINOPHEN 5-325 MG PO TABS: 1.0000 | ORAL_TABLET | ORAL | Status: DC | PRN

## 2015-07-15 MED ORDER — ALBUTEROL SULFATE HFA 108 (90 BASE) MCG/ACT IN AERS: 2.0000 | INHALATION_SPRAY | RESPIRATORY_TRACT | Status: DC | PRN

## 2015-07-15 MED ORDER — AZITHROMYCIN 250 MG PO TABS: ORAL_TABLET | ORAL | Status: DC

## 2015-07-15 MED ORDER — IPRATROPIUM-ALBUTEROL 0.5-2.5 (3) MG/3ML IN SOLN
3.0000 mL | Freq: Once | RESPIRATORY_TRACT | Status: AC
  Administered 2015-07-15: 3 mL via RESPIRATORY_TRACT
  Filled 2015-07-15: qty 3

## 2015-07-15 MED ORDER — PREDNISONE 10 MG PO TABS: 10.0000 mg | ORAL_TABLET | ORAL | Status: DC

## 2015-07-15 MED ORDER — PSEUDOEPH-BROMPHEN-DM 30-2-10 MG/5ML PO SYRP: 10.0000 mL | ORAL_SOLUTION | Freq: Four times a day (QID) | ORAL | Status: DC | PRN

## 2015-07-15 NOTE — ED Notes (Signed)
Seen in ED on Saturday, diagnosed with Bronchitis.  Only given prednisone.  Symptoms not improving.  Cough persists.

## 2015-07-15 NOTE — ED Provider Notes (Signed)
[facility] Emergency Department Provider Note  ____________________________________________  Time seen: Approximately 2:46 PM  I have reviewed the triage vital signs and the nursing notes.   HISTORY  Chief Complaint Cough    HPI Robin Conway is a 49 y.o. female returns to the emergency department for her cough. She states that she was seen here and diagnosed with bronchitis. She was sent home with cough syrup and steroids as well as an albuterol inhaler. She states that her symptoms are not improving and is requesting an antibiotic for same. The patient states that she has degenerative disc disease throughout her spine and due to the increasing coughing she is now experiencing back pain as well. Patient denies any headache, visual acuity changes, neck pain, chest tightness, difficulty breathing, abdominal pain, nausea vomiting, numbness or tingling.Denies fevers or chills.   Past Medical History  Diagnosis Date  . Mitral valve disorder   . Degenerative disc disease, lumbar   . Hypertension   . Diabetes mellitus without complication (HCC)   . Bipolar 1 disorder, depressed, severe (HCC)     There are no active problems to display for this patient.   Past Surgical History  Procedure Laterality Date  . Tubal ligation    . Fracture surgery      Current Outpatient Rx  Name  Route  Sig  Dispense  Refill  . albuterol (PROVENTIL HFA;VENTOLIN HFA) 108 (90 BASE) MCG/ACT inhaler   Inhalation   Inhale 2 puffs into the lungs every 4 (four) hours as needed for wheezing or shortness of breath.   1 Inhaler   0   . azithromycin (ZITHROMAX Z-PAK) 250 MG tablet      Take 2 tablets (500 mg) on  Day 1,  followed by 1 tablet (250 mg) once daily on Days 2 through 5.   6 each   0   . brompheniramine-pseudoephedrine-DM 30-2-10 MG/5ML syrup   Oral   Take 10 mLs by mouth 4 (four) times daily as needed.   200 mL   0   . chlorpheniramine-HYDROcodone (TUSSIONEX  PENNKINETIC ER) 10-8 MG/5ML SUER   Oral   Take 5 mLs by mouth 2 (two) times daily.   140 mL   0   . HYDROcodone-acetaminophen (NORCO/VICODIN) 5-325 MG tablet   Oral   Take 1 tablet by mouth every 4 (four) hours as needed for severe pain.   10 tablet   0   . methocarbamol (ROBAXIN-750) 750 MG tablet   Oral   Take 2 tablets (1,500 mg total) by mouth 4 (four) times daily.   40 tablet   0   . oxyCODONE-acetaminophen (ROXICET) 5-325 MG per tablet   Oral   Take 1 tablet by mouth every 6 (six) hours as needed.   20 tablet   0   . predniSONE (DELTASONE) 10 MG tablet   Oral   Take 1 tablet (10 mg total) by mouth as directed.   21 tablet   0     Take on a daily basis of 6, 5, 4, 3, 2, 1   . promethazine-codeine (PHENERGAN WITH CODEINE) 6.25-10 MG/5ML syrup   Oral   Take 5 mLs by mouth every 6 (six) hours as needed for cough.   120 mL   0     Allergies Doxycycline; Propoxyphene; Sulfa antibiotics; and Penicillins  No family history on file.  Social History Social History  Substance Use Topics  . Smoking status: Current Every Day Smoker  Types: Cigarettes  . Smokeless tobacco: None  . Alcohol Use: No    Review of Systems Constitutional: No fever/chills Eyes: No visual changes. ENT: No sore throat. Cardiovascular: Denies chest pain. Respiratory: Denies shortness of breath. Endorses cough. Gastrointestinal: No abdominal pain.  No nausea, no vomiting.  No diarrhea.  No constipation. Genitourinary: Negative for dysuria. Musculoskeletal: Endorses diffuse back pain. Skin: Negative for rash. Neurological: Negative for headaches, focal weakness or numbness.  10-point ROS otherwise negative.  ____________________________________________   PHYSICAL EXAM:  VITAL SIGNS: ED Triage Vitals  Enc Vitals Group     BP 07/15/15 1337 150/83 mmHg     Pulse Rate 07/15/15 1337 98     Resp 07/15/15 1337 18     Temp 07/15/15 1337 98.1 F (36.7 C)     Temp Source 07/15/15  1337 Oral     SpO2 07/15/15 1337 97 %     Weight --      Height --      Head Cir --      Peak Flow --      Pain Score 07/15/15 1333 6     Pain Loc --      Pain Edu? --      Excl. in GC? --     Constitutional: Alert and oriented. Well appearing and in no acute distress. Eyes: Conjunctivae are normal. PERRL. EOMI. Head: Atraumatic. Nose: No congestion/rhinnorhea. Mouth/Throat: Mucous membranes are moist.  Oropharynx non-erythematous. Neck: No stridor.   Hematological/Lymphatic/Immunilogical: He is, mobile, nontender anterior cervical lymphadenopathy. Cardiovascular: Normal rate, regular rhythm. Grossly normal heart sounds.  Good peripheral circulation. Respiratory: Normal respiratory effort.  No retractions. Diffuse wheezing bilateral lungs. No rales or rhonchi. No absent or decreased breath sounds. Good air entry into the bases. Gastrointestinal: Soft and nontender. No distention. No abdominal bruits. No CVA tenderness. Musculoskeletal: No lower extremity tenderness nor edema.  No joint effusions. Neurologic:  Normal speech and language. No gross focal neurologic deficits are appreciated. No gait instability. Skin:  Skin is warm, dry and intact. No rash noted. Psychiatric: Mood and affect are normal. Speech and behavior are normal.  ____________________________________________   LABS (all labs ordered are listed, but only abnormal results are displayed)  Labs Reviewed - No data to display ____________________________________________  EKG   ____________________________________________  RADIOLOGY   ____________________________________________   PROCEDURES  Procedure(s) performed: None  Critical Care performed: No  ____________________________________________   INITIAL IMPRESSION / ASSESSMENT AND PLAN / ED COURSE  Pertinent labs & imaging results that were available during my care of the patient were reviewed by me and considered in my medical decision making (see  chart for details).  The patient's history, symptoms, physical exam taken into consideration her diagnosis. The patient is now 10 days and to her symptoms and will be treated for bacterial infection. Patient will be given additional steroid Dosepak, refill of albuterol, cough syrup, antibiotics, and symptomatic medication for degenerative disc disease aggravation from constant coughing. Patient verbalizes understanding of the diagnosis and treatment plan and verbalizes compliance with same. Patient will follow-up with primary care should symptoms persist past treatment course. ____________________________________________   FINAL CLINICAL IMPRESSION(S) / ED DIAGNOSES  Final diagnoses:  Bronchitis      Racheal PatchesJonathan D Kelsa Jaworowski, PA-C 07/15/15 1537  Myrna Blazeravid Matthew Schaevitz, MD 07/15/15 [phone]

## 2015-07-15 NOTE — Discharge Instructions (Signed)
Upper Respiratory Infection, Adult Most upper respiratory infections (URIs) are a viral infection of the air passages leading to the lungs. A URI affects the nose, throat, and upper air passages. The most common type of URI is nasopharyngitis and is typically referred to as "the common cold." URIs run their course and usually go away on their own. Most of the time, a URI does not require medical attention, but sometimes a bacterial infection in the upper airways can follow a viral infection. This is called a secondary infection. Sinus and middle ear infections are common types of secondary upper respiratory infections. Bacterial pneumonia can also complicate a URI. A URI can worsen asthma and chronic obstructive pulmonary disease (COPD). Sometimes, these complications can require emergency medical care and may be life threatening.  CAUSES Almost all URIs are caused by viruses. A virus is a type of germ and can spread from one person to another.  RISKS FACTORS You may be at risk for a URI if:   You smoke.   You have chronic heart or lung disease.  You have a weakened defense (immune) system.   You are very young or very old.   You have nasal allergies or asthma.  You work in crowded or poorly ventilated areas.  You work in health care facilities or schools. SIGNS AND SYMPTOMS  Symptoms typically develop 2-3 days after you come in contact with a cold virus. Most viral URIs last 7-10 days. However, viral URIs from the influenza virus (flu virus) can last 14-18 days and are typically more severe. Symptoms may include:   Runny or stuffy (congested) nose.   Sneezing.   Cough.   Sore throat.   Headache.   Fatigue.   Fever.   Loss of appetite.   Pain in your forehead, behind your eyes, and over your cheekbones (sinus pain).  Muscle aches.  DIAGNOSIS  Your health care provider may diagnose a URI by:  Physical exam.  Tests to check that your symptoms are not due to  another condition such as:  Strep throat.  Sinusitis.  Pneumonia.  Asthma. TREATMENT  A URI goes away on its own with time. It cannot be cured with medicines, but medicines may be prescribed or recommended to relieve symptoms. Medicines may help:  Reduce your fever.  Reduce your cough.  Relieve nasal congestion. HOME CARE INSTRUCTIONS   Take medicines only as directed by your health care provider.   Gargle warm saltwater or take cough drops to comfort your throat as directed by your health care provider.  Use a warm mist humidifier or inhale steam from a shower to increase air moisture. This may make it easier to breathe.  Drink enough fluid to keep your urine clear or pale yellow.   Eat soups and other clear broths and maintain good nutrition.   Rest as needed.   Return to work when your temperature has returned to normal or as your health care provider advises. You may need to stay home longer to avoid infecting others. You can also use a face mask and careful hand washing to prevent spread of the virus.  Increase the usage of your inhaler if you have asthma.   Do not use any tobacco products, including cigarettes, chewing tobacco, or electronic cigarettes. If you need help quitting, ask your health care provider. PREVENTION  The best way to protect yourself from getting a cold is to practice good hygiene.   Avoid oral or hand contact with people with cold   symptoms.   Wash your hands often if contact occurs.  There is no clear evidence that vitamin C, vitamin E, echinacea, or exercise reduces the chance of developing a cold. However, it is always recommended to get plenty of rest, exercise, and practice good nutrition.  SEEK MEDICAL CARE IF:   You are getting worse rather than better.   Your symptoms are not controlled by medicine.   You have chills.  You have worsening shortness of breath.  You have brown or red mucus.  You have yellow or brown nasal  discharge.  You have pain in your face, especially when you bend forward.  You have a fever.  You have swollen neck glands.  You have pain while swallowing.  You have white areas in the back of your throat. SEEK IMMEDIATE MEDICAL CARE IF:   You have severe or persistent:  Headache.  Ear pain.  Sinus pain.  Chest pain.  You have chronic lung disease and any of the following:  Wheezing.  Prolonged cough.  Coughing up blood.  A change in your usual mucus.  You have a stiff neck.  You have changes in your:  Vision.  Hearing.  Thinking.  Mood. MAKE SURE YOU:   Understand these instructions.  Will watch your condition.  Will get help right away if you are not doing well or get worse.   This information is not intended to replace advice given to you by your health care provider. Make sure you discuss any questions you have with your health care provider.   Document Released: 01/26/2001 Document Revised: 12/17/2014 Document Reviewed: 11/07/2013 [employer] Interactive Patient Education 2016 [employer]  

## 2015-10-03 ENCOUNTER — Encounter: Payer: Self-pay | Admitting: Emergency Medicine

## 2015-10-03 ENCOUNTER — Emergency Department
Admission: EM | Admit: 2015-10-03 | Discharge: 2015-10-03 | Disposition: A | Discharge status: Home / Self Care | Payer: Self-pay | Attending: Emergency Medicine | Admitting: Emergency Medicine

## 2015-10-03 ENCOUNTER — Emergency Department: Payer: Self-pay

## 2015-10-03 DIAGNOSIS — M545 Low back pain, unspecified: Secondary | ICD-10-CM

## 2015-10-03 DIAGNOSIS — E119 Type 2 diabetes mellitus without complications: Secondary | ICD-10-CM | POA: Insufficient documentation

## 2015-10-03 DIAGNOSIS — M47816 Spondylosis without myelopathy or radiculopathy, lumbar region: Secondary | ICD-10-CM | POA: Insufficient documentation

## 2015-10-03 DIAGNOSIS — W010XXA Fall on same level from slipping, tripping and stumbling without subsequent striking against object, initial encounter: Secondary | ICD-10-CM | POA: Insufficient documentation

## 2015-10-03 DIAGNOSIS — Z880 Allergy status to penicillin: Secondary | ICD-10-CM | POA: Insufficient documentation

## 2015-10-03 DIAGNOSIS — Y92009 Unspecified place in unspecified non-institutional (private) residence as the place of occurrence of the external cause: Secondary | ICD-10-CM | POA: Insufficient documentation

## 2015-10-03 DIAGNOSIS — F17210 Nicotine dependence, cigarettes, uncomplicated: Secondary | ICD-10-CM | POA: Insufficient documentation

## 2015-10-03 DIAGNOSIS — I10 Essential (primary) hypertension: Secondary | ICD-10-CM | POA: Insufficient documentation

## 2015-10-03 DIAGNOSIS — Y998 Other external cause status: Secondary | ICD-10-CM | POA: Insufficient documentation

## 2015-10-03 DIAGNOSIS — Y9301 Activity, walking, marching and hiking: Secondary | ICD-10-CM | POA: Insufficient documentation

## 2015-10-03 MED ORDER — CYCLOBENZAPRINE HCL 10 MG PO TABS: 10.0000 mg | ORAL_TABLET | Freq: Three times a day (TID) | ORAL | Status: DC | PRN

## 2015-10-03 MED ORDER — KETOROLAC TROMETHAMINE 60 MG/2ML IM SOLN
60.0000 mg | Freq: Once | INTRAMUSCULAR | Status: AC
  Administered 2015-10-03: 60 mg via INTRAMUSCULAR
  Filled 2015-10-03: qty 2

## 2015-10-03 MED ORDER — ONDANSETRON 4 MG PO TBDP
ORAL_TABLET | ORAL | Status: AC
  Administered 2015-10-03: 4 mg via ORAL
  Filled 2015-10-03: qty 1

## 2015-10-03 MED ORDER — ONDANSETRON 4 MG PO TBDP
4.0000 mg | ORAL_TABLET | Freq: Once | ORAL | Status: AC
  Administered 2015-10-03: 4 mg via ORAL

## 2015-10-03 MED ORDER — ONDANSETRON 4 MG PO TBDP: 4.0000 mg | ORAL_TABLET | Freq: Once | ORAL | Status: DC

## 2015-10-03 MED ORDER — HYDROCODONE-ACETAMINOPHEN 5-325 MG PO TABS: 1.0000 | ORAL_TABLET | ORAL | Status: DC | PRN

## 2015-10-03 MED ORDER — CYCLOBENZAPRINE HCL 10 MG PO TABS
5.0000 mg | ORAL_TABLET | Freq: Once | ORAL | Status: AC
  Administered 2015-10-03: 5 mg via ORAL
  Filled 2015-10-03: qty 1

## 2015-10-03 NOTE — Discharge Instructions (Signed)
Schedule an appointment with orthopedics. Continue the Meloxicam. Return to the ER for symptoms that change or worsen if unable to schedule an appointment with your orthopedist or primary care provider.   Back Pain, Adult Back pain is very common in adults.The cause of back pain is rarely dangerous and the pain often gets better over time.The cause of your back pain may not be known. Some common causes of back pain include:  Strain of the muscles or ligaments supporting the spine.  Wear and tear (degeneration) of the spinal disks.  Arthritis.  Direct injury to the back. For many people, back pain may return. Since back pain is rarely dangerous, most people can learn to manage this condition on their own. HOME CARE INSTRUCTIONS Watch your back pain for any changes. The following actions may help to lessen any discomfort you are feeling:  Remain active. It is stressful on your back to sit or stand in one place for long periods of time. Do not sit, drive, or stand in one place for more than 30 minutes at a time. Take short walks on even surfaces as soon as you are able.Try to increase the length of time you walk each day.  Exercise regularly as directed by your health care provider. Exercise helps your back heal faster. It also helps avoid future injury by keeping your muscles strong and flexible.  Do not stay in bed.Resting more than 1-2 days can delay your recovery.  Pay attention to your body when you bend and lift. The most comfortable positions are those that put less stress on your recovering back. Always use proper lifting techniques, including:  Bending your knees.  Keeping the load close to your body.  Avoiding twisting.  Find a comfortable position to sleep. Use a firm mattress and lie on your side with your knees slightly bent. If you lie on your back, put a pillow under your knees.  Avoid feeling anxious or stressed.Stress increases muscle tension and can worsen back  pain.It is important to recognize when you are anxious or stressed and learn ways to manage it, such as with exercise.  Take medicines only as directed by your health care provider. Over-the-counter medicines to reduce pain and inflammation are often the most helpful.Your health care provider may prescribe muscle relaxant drugs.These medicines help dull your pain so you can more quickly return to your normal activities and healthy exercise.  Apply ice to the injured area:  Put ice in a plastic bag.  Place a towel between your skin and the bag.  Leave the ice on for 20 minutes, 2-3 times a day for the first 2-3 days. After that, ice and heat may be alternated to reduce pain and spasms.  Maintain a healthy weight. Excess weight puts extra stress on your back and makes it difficult to maintain good posture. SEEK MEDICAL CARE IF:  You have pain that is not relieved with rest or medicine.  You have increasing pain going down into the legs or buttocks.  You have pain that does not improve in one week.  You have night pain.  You lose weight.  You have a fever or chills. SEEK IMMEDIATE MEDICAL CARE IF:   You develop new bowel or bladder control problems.  You have unusual weakness or numbness in your arms or legs.  You develop nausea or vomiting.  You develop abdominal pain.  You feel faint.   This information is not intended to replace advice given to you by your  health care provider. Make sure you discuss any questions you have with your health care provider.   Document Released: 08/02/2005 Document Revised: 08/23/2014 Document Reviewed: 12/04/2013 Elsevier Interactive Patient Education [employer].

## 2015-10-03 NOTE — ED Provider Notes (Signed)
[facility] Emergency Department Provider Note ____________________________________________  Time seen: Approximately 6:06 PM  I have reviewed the triage vital signs and the nursing notes.   HISTORY  Chief Complaint Fall    HPI Robin Conway is a 50 y.o. female who presents to the emergency department for evaluation of back pain. She states that while walking down a ramp at her sister's house 2 days ago, she slipped on a wet area and fell, landing directly on her lower back. She has attempted to take Tylenol and ibuprofen without any relief. Patient denies loss of bowel or bladder control. She has a history of degenerative disc disease in the lumbar region. She tells her that she takes meloxicam occasionally.  Past Medical History  Diagnosis Date  . Mitral valve disorder   . Degenerative disc disease, lumbar   . Hypertension   . Diabetes mellitus without complication (HCC)   . Bipolar 1 disorder, depressed, severe (HCC)     There are no active problems to display for this patient.   Past Surgical History  Procedure Laterality Date  . Tubal ligation    . Fracture surgery      Current Outpatient Rx  Name  Route  Sig  Dispense  Refill  . albuterol (PROVENTIL HFA;VENTOLIN HFA) 108 (90 BASE) MCG/ACT inhaler   Inhalation   Inhale 2 puffs into the lungs every 4 (four) hours as needed for wheezing or shortness of breath.   1 Inhaler   0   . azithromycin (ZITHROMAX Z-PAK) 250 MG tablet      Take 2 tablets (500 mg) on  Day 1,  followed by 1 tablet (250 mg) once daily on Days 2 through 5.   6 each   0   . brompheniramine-pseudoephedrine-DM 30-2-10 MG/5ML syrup   Oral   Take 10 mLs by mouth 4 (four) times daily as needed.   200 mL   0   . cyclobenzaprine (FLEXERIL) 10 MG tablet   Oral   Take 1 tablet (10 mg total) by mouth 3 (three) times daily as needed for muscle spasms.   30 tablet   0   . HYDROcodone-acetaminophen (NORCO/VICODIN) 5-325 MG  tablet   Oral   Take 1 tablet by mouth every 4 (four) hours as needed for moderate pain.   12 tablet   0   . predniSONE (DELTASONE) 10 MG tablet   Oral   Take 1 tablet (10 mg total) by mouth as directed.   21 tablet   0     Take on a daily basis of 6, 5, 4, 3, 2, 1     Allergies Doxycycline; Propoxyphene; Sulfa antibiotics; and Penicillins  No family history on file.  Social History Social History  Substance Use Topics  . Smoking status: Current Every Day Smoker    Types: Cigarettes  . Smokeless tobacco: None  . Alcohol Use: No    Review of Systems Constitutional: No recent illness. ENT: No sore throat. Cardiovascular: Denies chest pain or palpitations. Respiratory: Denies shortness of breath. Gastrointestinal: No abdominal pain.  Genitourinary: Negative for dysuria. Musculoskeletal: Pain in the lower lumbar area. Skin: Negative for rash. Neurological: Negative for headaches, focal weakness or numbness. 10-point ROS otherwise unremarkable.  ____________________________________________   PHYSICAL EXAM:  VITAL SIGNS: ED Triage Vitals  Enc Vitals Group     BP 10/03/15 1650 116/78 mmHg     Pulse Rate 10/03/15 1650 82     Resp 10/03/15 1650 18  Temp 10/03/15 1650 97.5 F (36.4 C)     Temp Source 10/03/15 1650 Oral     SpO2 10/03/15 1650 98 %     Weight 10/03/15 1650 211 lb (95.709 kg)     Height 10/03/15 1650  (1.6 m)     Head Cir --      Peak Flow --      Pain Score 10/03/15 1648 7     Pain Loc --      Pain Edu? --      Excl. in GC? --     Constitutional: Alert and oriented. Well appearing and in no acute distress. Eyes: Conjunctivae are normal. EOMI. Head: Atraumatic. Nose: No congestion/rhinnorhea. Neck: No stridor.  Respiratory: Normal respiratory effort.   Musculoskeletal: Transverse lower lumbar tenderness on exam with mild focal midline tenderness noted in the lower lumbar region. Neurologic:  Normal speech and language. No gross  focal neurologic deficits are appreciated. Speech is normal. No gait instability-observed ambulating in the department without assistance. Skin:  Skin is warm, dry and intact. Atraumatic. No contusion or abrasion noted. Psychiatric: Mood and affect are normal. Speech and behavior are normal.  ____________________________________________   LABS (all labs ordered are listed, but only abnormal results are displayed)  Labs Reviewed - No data to display ____________________________________________  RADIOLOGY  Lumbar films negative for acute abnormality per radiology. ____________________________________________   PROCEDURES  Procedure(s) performed: None   ____________________________________________   INITIAL IMPRESSION / ASSESSMENT AND PLAN / ED COURSE  Pertinent labs & imaging results that were available during my care of the patient were reviewed by me and considered in my medical decision making (see chart for details).  Patient is to follow-up with orthopedics. She is to follow-up with primary care for symptoms that are not improving over the next 2-3 days. She is to return to the emergency department for symptoms that change or worsen if she is unable to schedule an appointment with either one. She was advised not to take meloxicam and the ibuprofen and she was advised to take one or the other. Of note, the patient denies having history of diabetes. ____________________________________________   FINAL CLINICAL IMPRESSION(S) / ED DIAGNOSES  Final diagnoses:  Acute lumbar back pain  Facet degeneration of lumbar region       Chinita Pester, FNP 10/03/15 2048  Jene Every, MD 10/03/15 [phone]

## 2015-10-03 NOTE — ED Notes (Signed)
States she fell about 2 days ago  twisted her back  Having pain to lower back

## 2015-10-30 ENCOUNTER — Emergency Department
Admission: EM | Admit: 2015-10-30 | Discharge: 2015-10-30 | Disposition: A | Discharge status: Home / Self Care | Payer: Self-pay | Attending: Emergency Medicine | Admitting: Emergency Medicine

## 2015-10-30 ENCOUNTER — Encounter: Payer: Self-pay | Admitting: Emergency Medicine

## 2015-10-30 DIAGNOSIS — M5442 Lumbago with sciatica, left side: Secondary | ICD-10-CM | POA: Insufficient documentation

## 2015-10-30 DIAGNOSIS — Z880 Allergy status to penicillin: Secondary | ICD-10-CM | POA: Insufficient documentation

## 2015-10-30 DIAGNOSIS — F17210 Nicotine dependence, cigarettes, uncomplicated: Secondary | ICD-10-CM | POA: Insufficient documentation

## 2015-10-30 DIAGNOSIS — I10 Essential (primary) hypertension: Secondary | ICD-10-CM | POA: Insufficient documentation

## 2015-10-30 DIAGNOSIS — M5432 Sciatica, left side: Secondary | ICD-10-CM

## 2015-10-30 DIAGNOSIS — Z79899 Other long term (current) drug therapy: Secondary | ICD-10-CM | POA: Insufficient documentation

## 2015-10-30 DIAGNOSIS — E119 Type 2 diabetes mellitus without complications: Secondary | ICD-10-CM | POA: Insufficient documentation

## 2015-10-30 MED ORDER — OXYCODONE-ACETAMINOPHEN 5-325 MG PO TABS
1.0000 | ORAL_TABLET | Freq: Four times a day (QID) | ORAL | Status: DC | PRN
Start: 2015-10-30 — End: 2016-01-19

## 2015-10-30 MED ORDER — PREDNISONE 20 MG PO TABS
60.0000 mg | ORAL_TABLET | Freq: Once | ORAL | Status: AC
  Administered 2015-10-30: 60 mg via ORAL
  Filled 2015-10-30: qty 3

## 2015-10-30 MED ORDER — OXYCODONE-ACETAMINOPHEN 5-325 MG PO TABS
1.0000 | ORAL_TABLET | Freq: Once | ORAL | Status: AC
  Administered 2015-10-30: 1 via ORAL
  Filled 2015-10-30: qty 1

## 2015-10-30 MED ORDER — PREDNISONE 10 MG (21) PO TBPK: ORAL_TABLET | ORAL | Status: DC

## 2015-10-30 NOTE — ED Notes (Signed)
Pt reports hx of DDD; sees ortho on 4/9; reports lower back pain that radiates into hips and difficulty walking on left leg.

## 2015-10-30 NOTE — ED Notes (Signed)
Pt presents with hx of degenerative disk disease, and for the last week she has had pain shooting down her left leg through the left hip. Pt has appointment with Dr. Hyacinth MeekerMiller on April 9, but did not feel like she could wait. Denies injury or trauma to back or leg. Pt alert & oriented with NAD noted.

## 2015-10-30 NOTE — ED Provider Notes (Signed)
[facility] Emergency Department Provider Note  ____________________________________________  Time seen: Approximately 3:33 PM  I have reviewed the triage vital signs and the nursing notes.   HISTORY  Chief Complaint Back Pain    HPI Elly ModenaShelda D Hoes is a 50 y.o. female with history of back pain, degenerative disc disease who was seen approximately one month ago after a fall for low back pain. She has continued to have back pain with primarily on the left side, radiation down the left leg to the left thigh. Occasional numbness and tingling. Worse with walking. Her bowels a bit sluggish. No urinary changes. No fevers and chills.She denies a history of diabetes.   Past Medical History  Diagnosis Date  . Mitral valve disorder   . Degenerative disc disease, lumbar   . Hypertension   . Diabetes mellitus without complication (HCC)   . Bipolar 1 disorder, depressed, severe (HCC)     There are no active problems to display for this patient.   Past Surgical History  Procedure Laterality Date  . Tubal ligation    . Fracture surgery      Current Outpatient Rx  Name  Route  Sig  Dispense  Refill  . albuterol (PROVENTIL HFA;VENTOLIN HFA) 108 (90 BASE) MCG/ACT inhaler   Inhalation   Inhale 2 puffs into the lungs every 4 (four) hours as needed for wheezing or shortness of breath.   1 Inhaler   0   . azithromycin (ZITHROMAX Z-PAK) 250 MG tablet      Take 2 tablets (500 mg) on  Day 1,  followed by 1 tablet (250 mg) once daily on Days 2 through 5.   6 each   0   . brompheniramine-pseudoephedrine-DM 30-2-10 MG/5ML syrup   Oral   Take 10 mLs by mouth 4 (four) times daily as needed.   200 mL   0   . cyclobenzaprine (FLEXERIL) 10 MG tablet   Oral   Take 1 tablet (10 mg total) by mouth 3 (three) times daily as needed for muscle spasms.   30 tablet   0   . HYDROcodone-acetaminophen (NORCO/VICODIN) 5-325 MG tablet   Oral   Take 1 tablet by mouth every 4  (four) hours as needed for moderate pain.   12 tablet   0   . oxyCODONE-acetaminophen (ROXICET) 5-325 MG tablet   Oral   Take 1 tablet by mouth every 6 (six) hours as needed.   20 tablet   0   . predniSONE (STERAPRED UNI-PAK 21 TAB) 10 MG (21) TBPK tablet      6 tablets on day 1, 5 tablets on day 2, 4 tablets on day 3, etc...   21 tablet   0     Allergies Doxycycline; Propoxyphene; Sulfa antibiotics; and Penicillins  No family history on file.  Social History Social History  Substance Use Topics  . Smoking status: Current Every Day Smoker    Types: Cigarettes  . Smokeless tobacco: None  . Alcohol Use: No    Review of Systems Constitutional: No fever/chills Eyes: No visual changes. ENT: No sore throat. Cardiovascular: Denies chest pain. Respiratory: Denies shortness of breath. Gastrointestinal: No abdominal pain.  Occasional nausea, no vomiting.  No diarrhea.   Genitourinary: Negative for dysuria. Musculoskeletal: Negative for back pain. Skin: Negative for rash. Neurological: Negative for headaches, focal weakness or numbness. 10-point ROS otherwise negative.  ____________________________________________   PHYSICAL EXAM:  VITAL SIGNS: ED Triage Vitals  Enc Vitals Group  BP 10/30/15 1123 139/85 mmHg     Pulse Rate 10/30/15 1123 93     Resp 10/30/15 1123 16     Temp 10/30/15 1123 97.6 F (36.4 C)     Temp Source 10/30/15 1123 Oral     SpO2 10/30/15 1123 97 %     Weight 10/30/15 1123 211 lb (95.709 kg)     Height 10/30/15 1123  (1.6 m)     Head Cir --      Peak Flow --      Pain Score 10/30/15 1124 10     Pain Loc --      Pain Edu? --      Excl. in GC? --     Constitutional: Alert and oriented. Well appearing and in no acute distress. Eyes: Conjunctivae are normal. PERRL. EOMI. Ears:  Clear with normal landmarks. No erythema. Head: Atraumatic. Nose: No congestion/rhinnorhea. Mouth/Throat: Mucous membranes are moist.  Oropharynx  non-erythematous. No lesions. Neck:  Supple.  No adenopathy.   Cardiovascular: Normal rate, regular rhythm. Grossly normal heart sounds.  Good peripheral circulation. Respiratory: Normal respiratory effort.  No retractions. Lungs CTAB. Gastrointestinal: Soft and nontender. No distention. No abdominal bruits. No CVA tenderness. Musculoskeletal: Nml ROM of upper and lower extremity joints.   tender over the lumbar spine and paraspinal muscles.  rom intact.  Neg SLR bilateral. nontender over the greater trochanter bilateral.  Neurologic:  Normal speech and language. No gross focal neurologic deficits are appreciated. No gait instability. Skin:  Skin is warm, dry and intact. No rash noted. Psychiatric: Mood and affect are normal. Speech and behavior are normal.  ____________________________________________   LABS (all labs ordered are listed, but only abnormal results are displayed)  Labs Reviewed - No data to display ____________________________________________  EKG   ____________________________________________  RADIOLOGY  Seen xray of lumbar spine from 10/03/15 ____________________________________________   PROCEDURES  Procedure(s) performed: None  Critical Care performed: No  ____________________________________________   INITIAL IMPRESSION / ASSESSMENT AND PLAN / ED COURSE  Pertinent labs & imaging results that were available during my care of the patient were reviewed by me and considered in my medical decision making (see chart for details).   50 year old female with acute on chronic low back pain, involving radiation down left leg consistent with sciatica. She is given prednisone taper and Percocet. She or he has muscle relaxants at home. She is  following with orthopedist in a few weeks. She will return to emergency if worsening symptoms. ____________________________________________   FINAL CLINICAL IMPRESSION(S) / ED DIAGNOSES  Final diagnoses:  Sciatica  associated with disorder of lumbar spine, left      Ignacia Bayley, PA-C 10/30/15 1607  Myrna Blazer, MD 10/31/15 [phone]

## 2015-10-30 NOTE — Discharge Instructions (Signed)
Sciatica With Rehab The sciatic nerve runs from the back down the leg and is responsible for sensation and control of the muscles in the back (posterior) side of the thigh, lower leg, and foot. Sciatica is a condition that is characterized by inflammation of this nerve.  SYMPTOMS   Signs of nerve damage, including numbness and/or weakness along the posterior side of the lower extremity.  Pain in the back of the thigh that may also travel down the leg.  Pain that worsens when sitting for long periods of time.  Occasionally, pain in the back or buttock. CAUSES  Inflammation of the sciatic nerve is the cause of sciatica. The inflammation is due to something irritating the nerve. Common sources of irritation include:  Sitting for long periods of time.  Direct trauma to the nerve.  Arthritis of the spine.  Herniated or ruptured disk.  Slipping of the vertebrae (spondylolisthesis).  Pressure from soft tissues, such as muscles or ligament-like tissue (fascia). RISK INCREASES WITH:  Sports that place pressure or stress on the spine (football or weightlifting).  Poor strength and flexibility.  Failure to warm up properly before activity.  Family history of low back pain or disk disorders.  Previous back injury or surgery.  Poor body mechanics, especially when lifting, or poor posture. PREVENTION   Warm up and stretch properly before activity.  Maintain physical fitness:  Strength, flexibility, and endurance.  Cardiovascular fitness.  Learn and use proper technique, especially with posture and lifting. When possible, have coach correct improper technique.  Avoid activities that place stress on the spine. PROGNOSIS If treated properly, then sciatica usually resolves within 6 weeks. However, occasionally surgery is necessary.  RELATED COMPLICATIONS   Permanent nerve damage, including pain, numbness, tingle, or weakness.  Chronic back pain.  Risks of surgery: infection,  bleeding, nerve damage, or damage to surrounding tissues. TREATMENT Treatment initially involves resting from any activities that aggravate your symptoms. The use of ice and medication may help reduce pain and inflammation. The use of strengthening and stretching exercises may help reduce pain with activity. These exercises may be performed at home or with referral to a therapist. A therapist may recommend further treatments, such as transcutaneous electronic nerve stimulation (TENS) or ultrasound. Your caregiver may recommend corticosteroid injections to help reduce inflammation of the sciatic nerve. If symptoms persist despite non-surgical (conservative) treatment, then surgery may be recommended. MEDICATION  If pain medication is necessary, then nonsteroidal anti-inflammatory medications, such as aspirin and ibuprofen, or other minor pain relievers, such as acetaminophen, are often recommended.  Do not take pain medication for 7 days before surgery.  Prescription pain relievers may be given if deemed necessary by your caregiver. Use only as directed and only as much as you need.  Ointments applied to the skin may be helpful.  Corticosteroid injections may be given by your caregiver. These injections should be reserved for the most serious cases, because they may only be given a certain number of times. HEAT AND COLD  Cold treatment (icing) relieves pain and reduces inflammation. Cold treatment should be applied for 10 to 15 minutes every 2 to 3 hours for inflammation and pain and immediately after any activity that aggravates your symptoms. Use ice packs or massage the area with a piece of ice (ice massage).  Heat treatment may be used prior to performing the stretching and strengthening activities prescribed by your caregiver, physical therapist, or athletic trainer. Use a heat pack or soak the injury in warm water.  SEEK MEDICAL CARE IF:  Treatment seems to offer no benefit, or the condition  worsens.  Any medications produce adverse side effects. EXERCISES  RANGE OF MOTION (ROM) AND STRETCHING EXERCISES - Sciatica Most people with sciatic will find that their symptoms worsen with either excessive bending forward (flexion) or arching at the low back (extension). The exercises which will help resolve your symptoms will focus on the opposite motion. Your physician, physical therapist or athletic trainer will help you determine which exercises will be most helpful to resolve your low back pain. Do not complete any exercises without first consulting with your clinician. Discontinue any exercises which worsen your symptoms until you speak to your clinician. If you have pain, numbness or tingling which travels down into your buttocks, leg or foot, the goal of the therapy is for these symptoms to move closer to your back and eventually resolve. Occasionally, these leg symptoms will get better, but your low back pain may worsen; this is typically an indication of progress in your rehabilitation. Be certain to be very alert to any changes in your symptoms and the activities in which you participated in the 24 hours prior to the change. Sharing this information with your clinician will allow him/her to most efficiently treat your condition. These exercises may help you when beginning to rehabilitate your injury. Your symptoms may resolve with or without further involvement from your physician, physical therapist or athletic trainer. While completing these exercises, remember:   Restoring tissue flexibility helps normal motion to return to the joints. This allows healthier, less painful movement and activity.  An effective stretch should be held for at least 30 seconds.  A stretch should never be painful. You should only feel a gentle lengthening or release in the stretched tissue. FLEXION RANGE OF MOTION AND STRETCHING EXERCISES: STRETCH - Flexion, Single Knee to Chest   Lie on a firm bed or floor  with both legs extended in front of you.  Keeping one leg in contact with the floor, bring your opposite knee to your chest. Hold your leg in place by either grabbing behind your thigh or at your knee.  Pull until you feel a gentle stretch in your low back. Hold __________ seconds.  Slowly release your grasp and repeat the exercise with the opposite side. Repeat __________ times. Complete this exercise __________ times per day.  STRETCH - Flexion, Double Knee to Chest  Lie on a firm bed or floor with both legs extended in front of you.  Keeping one leg in contact with the floor, bring your opposite knee to your chest.  Tense your stomach muscles to support your back and then lift your other knee to your chest. Hold your legs in place by either grabbing behind your thighs or at your knees.  Pull both knees toward your chest until you feel a gentle stretch in your low back. Hold __________ seconds.  Tense your stomach muscles and slowly return one leg at a time to the floor. Repeat __________ times. Complete this exercise __________ times per day.  STRETCH - Low Trunk Rotation   Lie on a firm bed or floor. Keeping your legs in front of you, bend your knees so they are both pointed toward the ceiling and your feet are flat on the floor.  Extend your arms out to the side. This will stabilize your upper body by keeping your shoulders in contact with the floor.  Gently and slowly drop both knees together to one side until  you feel a gentle stretch in your low back. Hold for __________ seconds.  Tense your stomach muscles to support your low back as you bring your knees back to the starting position. Repeat the exercise to the other side. Repeat __________ times. Complete this exercise __________ times per day  EXTENSION RANGE OF MOTION AND FLEXIBILITY EXERCISES: STRETCH - Extension, Prone on Elbows  Lie on your stomach on the floor, a bed will be too soft. Place your palms about shoulder  width apart and at the height of your head.  Place your elbows under your shoulders. If this is too painful, stack pillows under your chest.  Allow your body to relax so that your hips drop lower and make contact more completely with the floor.  Hold this position for __________ seconds.  Slowly return to lying flat on the floor. Repeat __________ times. Complete this exercise __________ times per day.  RANGE OF MOTION - Extension, Prone Press Ups  Lie on your stomach on the floor, a bed will be too soft. Place your palms about shoulder width apart and at the height of your head.  Keeping your back as relaxed as possible, slowly straighten your elbows while keeping your hips on the floor. You may adjust the placement of your hands to maximize your comfort. As you gain motion, your hands will come more underneath your shoulders.  Hold this position __________ seconds.  Slowly return to lying flat on the floor. Repeat __________ times. Complete this exercise __________ times per day.  STRENGTHENING EXERCISES - Sciatica  These exercises may help you when beginning to rehabilitate your injury. These exercises should be done near your "sweet spot." This is the neutral, low-back arch, somewhere between fully rounded and fully arched, that is your least painful position. When performed in this safe range of motion, these exercises can be used for people who have either a flexion or extension based injury. These exercises may resolve your symptoms with or without further involvement from your physician, physical therapist or athletic trainer. While completing these exercises, remember:   Muscles can gain both the endurance and the strength needed for everyday activities through controlled exercises.  Complete these exercises as instructed by your physician, physical therapist or athletic trainer. Progress with the resistance and repetition exercises only as your caregiver advises.  You may  experience muscle soreness or fatigue, but the pain or discomfort you are trying to eliminate should never worsen during these exercises. If this pain does worsen, stop and make certain you are following the directions exactly. If the pain is still present after adjustments, discontinue the exercise until you can discuss the trouble with your clinician. STRENGTHENING - Deep Abdominals, Pelvic Tilt   Lie on a firm bed or floor. Keeping your legs in front of you, bend your knees so they are both pointed toward the ceiling and your feet are flat on the floor.  Tense your lower abdominal muscles to press your low back into the floor. This motion will rotate your pelvis so that your tail bone is scooping upwards rather than pointing at your feet or into the floor.  With a gentle tension and even breathing, hold this position for __________ seconds. Repeat __________ times. Complete this exercise __________ times per day.  STRENGTHENING - Abdominals, Crunches   Lie on a firm bed or floor. Keeping your legs in front of you, bend your knees so they are both pointed toward the ceiling and your feet are flat on the  floor. Cross your arms over your chest.  Slightly tip your chin down without bending your neck.  Tense your abdominals and slowly lift your trunk high enough to just clear your shoulder blades. Lifting higher can put excessive stress on the low back and does not further strengthen your abdominal muscles.  Control your return to the starting position. Repeat __________ times. Complete this exercise __________ times per day.  STRENGTHENING - Quadruped, Opposite UE/LE Lift  Assume a hands and knees position on a firm surface. Keep your hands under your shoulders and your knees under your hips. You may place padding under your knees for comfort.  Find your neutral spine and gently tense your abdominal muscles so that you can maintain this position. Your shoulders and hips should form a rectangle  that is parallel with the floor and is not twisted.  Keeping your trunk steady, lift your right hand no higher than your shoulder and then your left leg no higher than your hip. Make sure you are not holding your breath. Hold this position __________ seconds.  Continuing to keep your abdominal muscles tense and your back steady, slowly return to your starting position. Repeat with the opposite arm and leg. Repeat __________ times. Complete this exercise __________ times per day.  STRENGTHENING - Abdominals and Quadriceps, Straight Leg Raise   Lie on a firm bed or floor with both legs extended in front of you.  Keeping one leg in contact with the floor, bend the other knee so that your foot can rest flat on the floor.  Find your neutral spine, and tense your abdominal muscles to maintain your spinal position throughout the exercise.  Slowly lift your straight leg off the floor about 6 inches for a count of 15, making sure to not hold your breath.  Still keeping your neutral spine, slowly lower your leg all the way to the floor. Repeat this exercise with each leg __________ times. Complete this exercise __________ times per day. POSTURE AND BODY MECHANICS CONSIDERATIONS - Sciatica Keeping correct posture when sitting, standing or completing your activities will reduce the stress put on different body tissues, allowing injured tissues a chance to heal and limiting painful experiences. The following are general guidelines for improved posture. Your physician or physical therapist will provide you with any instructions specific to your needs. While reading these guidelines, remember:  The exercises prescribed by your provider will help you have the flexibility and strength to maintain correct postures.  The correct posture provides the optimal environment for your joints to work. All of your joints have less wear and tear when properly supported by a spine with good posture. This means you will  experience a healthier, less painful body.  Correct posture must be practiced with all of your activities, especially prolonged sitting and standing. Correct posture is as important when doing repetitive low-stress activities (typing) as it is when doing a single heavy-load activity (lifting). RESTING POSITIONS Consider which positions are most painful for you when choosing a resting position. If you have pain with flexion-based activities (sitting, bending, stooping, squatting), choose a position that allows you to rest in a less flexed posture. You would want to avoid curling into a fetal position on your side. If your pain worsens with extension-based activities (prolonged standing, working overhead), avoid resting in an extended position such as sleeping on your stomach. Most people will find more comfort when they rest with their spine in a more neutral position, neither too rounded nor too  arched. Lying on a non-sagging bed on your side with a pillow between your knees, or on your back with a pillow under your knees will often provide some relief. Keep in mind, being in any one position for a prolonged period of time, no matter how correct your posture, can still lead to stiffness. PROPER SITTING POSTURE In order to minimize stress and discomfort on your spine, you must sit with correct posture Sitting with good posture should be effortless for a healthy body. Returning to good posture is a gradual process. Many people can work toward this most comfortably by using various supports until they have the flexibility and strength to maintain this posture on their own. When sitting with proper posture, your ears will fall over your shoulders and your shoulders will fall over your hips. You should use the back of the chair to support your upper back. Your low back will be in a neutral position, just slightly arched. You may place a small pillow or folded towel at the base of your low back for support.  When  working at a desk, create an environment that supports good, upright posture. Without extra support, muscles fatigue and lead to excessive strain on joints and other tissues. Keep these recommendations in mind: CHAIR:   A chair should be able to slide under your desk when your back makes contact with the back of the chair. This allows you to work closely.  The chair's height should allow your eyes to be level with the upper part of your monitor and your hands to be slightly lower than your elbows. BODY POSITION  Your feet should make contact with the floor. If this is not possible, use a foot rest.  Keep your ears over your shoulders. This will reduce stress on your neck and low back. INCORRECT SITTING POSTURES   If you are feeling tired and unable to assume a healthy sitting posture, do not slouch or slump. This puts excessive strain on your back tissues, causing more damage and pain. Healthier options include:  Using more support, like a lumbar pillow.  Switching tasks to something that requires you to be upright or walking.  Talking a brief walk.  Lying down to rest in a neutral-spine position. PROLONGED STANDING WHILE SLIGHTLY LEANING FORWARD  When completing a task that requires you to lean forward while standing in one place for a long time, place either foot up on a stationary 2-4 inch high object to help maintain the best posture. When both feet are on the ground, the low back tends to lose its slight inward curve. If this curve flattens (or becomes too large), then the back and your other joints will experience too much stress, fatigue more quickly and can cause pain.  CORRECT STANDING POSTURES Proper standing posture should be assumed with all daily activities, even if they only take a few moments, like when brushing your teeth. As in sitting, your ears should fall over your shoulders and your shoulders should fall over your hips. You should keep a slight tension in your abdominal  muscles to brace your spine. Your tailbone should point down to the ground, not behind your body, resulting in an over-extended swayback posture.  INCORRECT STANDING POSTURES  Common incorrect standing postures include a forward head, locked knees and/or an excessive swayback. WALKING Walk with an upright posture. Your ears, shoulders and hips should all line-up. PROLONGED ACTIVITY IN A FLEXED POSITION When completing a task that requires you to bend forward  at your waist or lean over a low surface, try to find a way to stabilize 3 of 4 of your limbs. You can place a hand or elbow on your thigh or rest a knee on the surface you are reaching across. This will provide you more stability so that your muscles do not fatigue as quickly. By keeping your knees relaxed, or slightly bent, you will also reduce stress across your low back. CORRECT LIFTING TECHNIQUES DO :   Assume a wide stance. This will provide you more stability and the opportunity to get as close as possible to the object which you are lifting.  Tense your abdominals to brace your spine; then bend at the knees and hips. Keeping your back locked in a neutral-spine position, lift using your leg muscles. Lift with your legs, keeping your back straight.  Test the weight of unknown objects before attempting to lift them.  Try to keep your elbows locked down at your sides in order get the best strength from your shoulders when carrying an object.  Always ask for help when lifting heavy or awkward objects. INCORRECT LIFTING TECHNIQUES DO NOT:   Lock your knees when lifting, even if it is a small object.  Bend and twist. Pivot at your feet or move your feet when needing to change directions.  Assume that you cannot safely pick up a paperclip without proper posture.   This information is not intended to replace advice given to you by your health care provider. Make sure you discuss any questions you have with your health care provider.     Document Released: 08/02/2005 Document Revised: 12/17/2014 Document Reviewed: 11/14/2008 [employer] Interactive Patient Education 2016 [employer]   Take prednisone taper and pain medicine as directed. Continue muscle relaxants. Begin exercises when pain improves. Follow-up with the orthopedist for further evaluation. Return to emergency for any worsening symptoms.

## 2016-01-19 ENCOUNTER — Emergency Department: Payer: Self-pay

## 2016-01-19 ENCOUNTER — Emergency Department
Admission: EM | Admit: 2016-01-19 | Discharge: 2016-01-19 | Disposition: A | Discharge status: Home / Self Care | Payer: Self-pay | Attending: Emergency Medicine | Admitting: Emergency Medicine

## 2016-01-19 ENCOUNTER — Encounter: Payer: Self-pay | Admitting: Emergency Medicine

## 2016-01-19 DIAGNOSIS — Y999 Unspecified external cause status: Secondary | ICD-10-CM | POA: Insufficient documentation

## 2016-01-19 DIAGNOSIS — F314 Bipolar disorder, current episode depressed, severe, without psychotic features: Secondary | ICD-10-CM | POA: Insufficient documentation

## 2016-01-19 DIAGNOSIS — F17210 Nicotine dependence, cigarettes, uncomplicated: Secondary | ICD-10-CM | POA: Insufficient documentation

## 2016-01-19 DIAGNOSIS — M5136 Other intervertebral disc degeneration, lumbar region: Secondary | ICD-10-CM | POA: Insufficient documentation

## 2016-01-19 DIAGNOSIS — Z8679 Personal history of other diseases of the circulatory system: Secondary | ICD-10-CM | POA: Insufficient documentation

## 2016-01-19 DIAGNOSIS — S300XXA Contusion of lower back and pelvis, initial encounter: Secondary | ICD-10-CM | POA: Insufficient documentation

## 2016-01-19 DIAGNOSIS — M5430 Sciatica, unspecified side: Secondary | ICD-10-CM

## 2016-01-19 DIAGNOSIS — M5440 Lumbago with sciatica, unspecified side: Secondary | ICD-10-CM | POA: Insufficient documentation

## 2016-01-19 DIAGNOSIS — Y92007 Garden or yard of unspecified non-institutional (private) residence as the place of occurrence of the external cause: Secondary | ICD-10-CM | POA: Insufficient documentation

## 2016-01-19 DIAGNOSIS — I10 Essential (primary) hypertension: Secondary | ICD-10-CM | POA: Insufficient documentation

## 2016-01-19 DIAGNOSIS — Y939 Activity, unspecified: Secondary | ICD-10-CM | POA: Insufficient documentation

## 2016-01-19 DIAGNOSIS — Z79899 Other long term (current) drug therapy: Secondary | ICD-10-CM | POA: Insufficient documentation

## 2016-01-19 DIAGNOSIS — W01190A Fall on same level from slipping, tripping and stumbling with subsequent striking against furniture, initial encounter: Secondary | ICD-10-CM | POA: Insufficient documentation

## 2016-01-19 MED ORDER — KETOROLAC TROMETHAMINE 60 MG/2ML IM SOLN
60.0000 mg | Freq: Once | INTRAMUSCULAR | Status: AC
  Administered 2016-01-19: 60 mg via INTRAMUSCULAR
  Filled 2016-01-19: qty 2

## 2016-01-19 MED ORDER — GABAPENTIN 100 MG PO CAPS: 100.0000 mg | ORAL_CAPSULE | Freq: Three times a day (TID) | ORAL | Status: DC

## 2016-01-19 MED ORDER — OXYCODONE-ACETAMINOPHEN 5-325 MG PO TABS: 1.0000 | ORAL_TABLET | ORAL | Status: DC | PRN

## 2016-01-19 MED ORDER — OXYCODONE-ACETAMINOPHEN 5-325 MG PO TABS
1.0000 | ORAL_TABLET | Freq: Once | ORAL | Status: AC
  Administered 2016-01-19: 1 via ORAL
  Filled 2016-01-19: qty 1

## 2016-01-19 NOTE — ED Notes (Signed)
Pt. Returned to tx room from XR.

## 2016-01-19 NOTE — ED Notes (Signed)
Patient transported to X-ray 

## 2016-01-19 NOTE — ED Provider Notes (Signed)
[facility] Emergency Department Provider Note  ____________________________________________  Time seen: Approximately 2:32 PM  I have reviewed the triage vital signs and the nursing notes.   HISTORY  Chief Complaint Back Pain    HPI Robin Conway is a 50 y.o. female presents for evaluation of low back pain with radiation of pain down her right leg. Patient states that she has a history of sciatica and initially fell out of bed 4 days ago and then slipped on the wet grass and fell hitting a wooden pallet. Patient reports unable to comfortably and increased pain with activity. Currently 8/10 at this time.   Past Medical History  Diagnosis Date  . Mitral valve disorder   . Degenerative disc disease, lumbar   . Hypertension   . Bipolar 1 disorder, depressed, severe (HCC)     There are no active problems to display for this patient.   Past Surgical History  Procedure Laterality Date  . Tubal ligation    . Fracture surgery      Current Outpatient Rx  Name  Route  Sig  Dispense  Refill  . albuterol (PROVENTIL HFA;VENTOLIN HFA) 108 (90 BASE) MCG/ACT inhaler   Inhalation   Inhale 2 puffs into the lungs every 4 (four) hours as needed for wheezing or shortness of breath.   1 Inhaler   0   . gabapentin (NEURONTIN) 100 MG capsule   Oral   Take 1 capsule (100 mg total) by mouth 3 (three) times daily.   60 capsule   0   . oxyCODONE-acetaminophen (ROXICET) 5-325 MG tablet   Oral   Take 1-2 tablets by mouth every 4 (four) hours as needed for severe pain.   15 tablet   0     Allergies Doxycycline; Propoxyphene; Sulfa antibiotics; and Penicillins  No family history on file.  Social History Social History  Substance Use Topics  . Smoking status: Current Every Day Smoker    Types: Cigarettes  . Smokeless tobacco: None  . Alcohol Use: No    Review of Systems Constitutional: No fever/chills Eyes: No visual changes. ENT: No sore  throat. Cardiovascular: Denies chest pain. Respiratory: Denies shortness of breath. Musculoskeletal: Positive for low back pain. Skin: Negative for rash. Neurological: Negative for headaches, focal weakness or numbness.  10-point ROS otherwise negative.  ____________________________________________   PHYSICAL EXAM:  VITAL SIGNS: ED Triage Vitals  Enc Vitals Group     BP 01/19/16 1335 150/75 mmHg     Pulse Rate 01/19/16 1335 83     Resp 01/19/16 1335 18     Temp 01/19/16 1335 97.9 F (36.6 C)     Temp Source 01/19/16 1335 Oral     SpO2 01/19/16 1335 98 %     Weight 01/19/16 1335 187 lb (84.823 kg)     Height 01/19/16 1335  (1.626 m)     Head Cir --      Peak Flow --      Pain Score 01/19/16 1337 8     Pain Loc --      Pain Edu? --      Excl. in GC? --     Constitutional: Alert and oriented. Well appearing and in no acute distress.  Cardiovascular: Normal rate, regular rhythm. Grossly normal heart sounds.  Good peripheral circulation. Respiratory: Normal respiratory effort.  No retractions. Lungs CTAB. Musculoskeletal: Straight leg raise positive right greater than left. Point tenderness to the lumbar coccyx area. Neurologic:  Normal speech and  language. No gross focal neurologic deficits are appreciated. No gait instability. Skin:  Skin is warm, dry and intact. No rash noted. Psychiatric: Mood and affect are normal. Speech and behavior are normal.  ____________________________________________   LABS (all labs ordered are listed, but only abnormal results are displayed)  Labs Reviewed - No data to display ____________________________________________  EKG   ____________________________________________  RADIOLOGY  No acute osseous findings ____________________________________________   PROCEDURES  Procedure(s) performed: None  Critical Care performed: No  ____________________________________________   INITIAL IMPRESSION / ASSESSMENT AND PLAN /  ED COURSE  Pertinent labs & imaging results that were available during my care of the patient were reviewed by me and considered in my medical decision making (see chart for details).  Status post fall with lumbar contusion. Coccyx contusion. Acute exacerbation of sciatica. Rx given for gabapentin milligrams 3 times a day and to follow-up with PCP. Continue with meloxicam and Robaxin. ____________________________________________   FINAL CLINICAL IMPRESSION(S) / ED DIAGNOSES  Final diagnoses:  Sciatica, unspecified laterality  Lumbar contusion, initial encounter     This chart was dictated using voice recognition software/Dragon. Despite best efforts to proofread, errors can occur which can change the meaning. Any change was purely unintentional.   Evangeline Dakinharles M Rien Marland, PA-C 01/19/16 1537  Jene Everyobert Kinner, MD 01/20/16 1252

## 2016-01-19 NOTE — ED Notes (Signed)
Pt. Reports falling twice in the past 4 days. 1st fall out of bed leading to soreness. 2nd fall in the yard onto a flat wooden piece that pt. Reports has caused a more intense soreness upon rest and a throbbing 8/10 with activity. No bruising present. Pt denies changes in BM, Urination, denies blood in urine or stool. Pt. Denies respiratory changes due to injury. Pt. Reports hx of Degenerative Disc Disease and sciatica complications.

## 2016-01-19 NOTE — ED Notes (Signed)
C/O falling out of bed  4 nights ago.  Initially patient had some soreness, then 2 days ago slid on wet grass.  C/O mid to low back pain that radiates down left leg.

## 2016-01-19 NOTE — Discharge Instructions (Signed)
Sciatica °Sciatica is pain, weakness, numbness, or tingling along the path of the sciatic nerve. The nerve starts in the lower back and runs down the back of each leg. The nerve controls the muscles in the lower leg and in the back of the knee, while also providing sensation to the back of the thigh, lower leg, and the sole of your foot. Sciatica is a symptom of another medical condition. For instance, nerve damage or certain conditions, such as a herniated disk or bone spur on the spine, pinch or put pressure on the sciatic nerve. This causes the pain, weakness, or other sensations normally associated with sciatica. Generally, sciatica only affects one side of the body. °CAUSES  °· Herniated or slipped disc. °· Degenerative disk disease. °· A pain disorder involving the narrow muscle in the buttocks (piriformis syndrome). °· Pelvic injury or fracture. °· Pregnancy. °· Tumor (rare). °SYMPTOMS  °Symptoms can vary from mild to very severe. The symptoms usually travel from the low back to the buttocks and down the back of the leg. Symptoms can include: °· Mild tingling or dull aches in the lower back, leg, or hip. °· Numbness in the back of the calf or sole of the foot. °· Burning sensations in the lower back, leg, or hip. °· Sharp pains in the lower back, leg, or hip. °· Leg weakness. °· Severe back pain inhibiting movement. °These symptoms may get worse with coughing, sneezing, laughing, or prolonged sitting or standing. Also, being overweight may worsen symptoms. °DIAGNOSIS  °Your caregiver will perform a physical exam to look for common symptoms of sciatica. He or she may ask you to do certain movements or activities that would trigger sciatic nerve pain. Other tests may be performed to find the cause of the sciatica. These may include: °· Blood tests. °· X-rays. °· Imaging tests, such as an MRI or CT scan. °TREATMENT  °Treatment is directed at the cause of the sciatic pain. Sometimes, treatment is not necessary  and the pain and discomfort goes away on its own. If treatment is needed, your caregiver may suggest: °· Over-the-counter medicines to relieve pain. °· Prescription medicines, such as anti-inflammatory medicine, muscle relaxants, or narcotics. °· Applying heat or ice to the painful area. °· Steroid injections to lessen pain, irritation, and inflammation around the nerve. °· Reducing activity during periods of pain. °· Exercising and stretching to strengthen your abdomen and improve flexibility of your spine. Your caregiver may suggest losing weight if the extra weight makes the back pain worse. °· Physical therapy. °· Surgery to eliminate what is pressing or pinching the nerve, such as a bone spur or part of a herniated disk. °HOME CARE INSTRUCTIONS  °· Only take over-the-counter or prescription medicines for pain or discomfort as directed by your caregiver. °· Apply ice to the affected area for 20 minutes, 3-4 times a day for the first 48-72 hours. Then try heat in the same way. °· Exercise, stretch, or perform your usual activities if these do not aggravate your pain. °· Attend physical therapy sessions as directed by your caregiver. °· Keep all follow-up appointments as directed by your caregiver. °· Do not wear high heels or shoes that do not provide proper support. °· Check your mattress to see if it is too soft. A firm mattress may lessen your pain and discomfort. °SEEK IMMEDIATE MEDICAL CARE IF:  °· You lose control of your bowel or bladder (incontinence). °· You have increasing weakness in the lower back, pelvis, buttocks,   or legs.  You have redness or swelling of your back.  You have a burning sensation when you urinate.  You have pain that gets worse when you lie down or awakens you at night.  Your pain is worse than you have experienced in the past.  Your pain is lasting longer than 4 weeks.  You are suddenly losing weight without reason. MAKE SURE YOU:  Understand these  instructions.  Will watch your condition.  Will get help right away if you are not doing well or get worse.   This information is not intended to replace advice given to you by your health care provider. Make sure you discuss any questions you have with your health care provider.   Document Released: 07/27/2001 Document Revised: 04/23/2015 Document Reviewed: 12/12/2011 Elsevier Interactive Patient Education 2016 Elsevier Inc.  Radicular Pain Radicular pain in either the arm or leg is usually from a bulging or herniated disk in the spine. A piece of the herniated disk may press against the nerves as the nerves exit the spine. This causes pain which is felt at the tips of the nerves down the arm or leg. Other causes of radicular pain may include:  Fractures.  Heart disease.  Cancer.  An abnormal and usually degenerative state of the nervous system or nerves (neuropathy). Diagnosis may require CT or MRI scanning to determine the primary cause.  Nerves that start at the neck (nerve roots) may cause radicular pain in the outer shoulder and arm. It can spread down to the thumb and fingers. The symptoms vary depending on which nerve root has been affected. In most cases radicular pain improves with conservative treatment. Neck problems may require physical therapy, a neck collar, or cervical traction. Treatment may take many weeks, and surgery may be considered if the symptoms do not improve.  Conservative treatment is also recommended for sciatica. Sciatica causes pain to radiate from the lower back or buttock area down the leg into the foot. Often there is a history of back problems. Most patients with sciatica are better after 2 to 4 weeks of rest and other supportive care. Short term bed rest can reduce the disk pressure considerably. Sitting, however, is not a good position since this increases the pressure on the disk. You should avoid bending, lifting, and all other activities which make the  problem worse. Traction can be used in severe cases. Surgery is usually reserved for patients who do not improve within the first months of treatment. Only take over-the-counter or prescription medicines for pain, discomfort, or fever as directed by your caregiver. Narcotics and muscle relaxants may help by relieving more severe pain and spasm and by providing mild sedation. Cold or massage can give significant relief. Spinal manipulation is not recommended. It can increase the degree of disc protrusion. Epidural steroid injections are often effective treatment for radicular pain. These injections deliver medicine to the spinal nerve in the space between the protective covering of the spinal cord and back bones (vertebrae). Your caregiver can give you more information about steroid injections. These injections are most effective when given within two weeks of the onset of pain.  You should see your caregiver for follow up care as recommended. A program for neck and back injury rehabilitation with stretching and strengthening exercises is an important part of management.  SEEK IMMEDIATE MEDICAL CARE IF:  You develop increased pain, weakness, or numbness in your arm or leg.  You develop difficulty with bladder or bowel control.  You develop abdominal pain.   This information is not intended to replace advice given to you by your health care provider. Make sure you discuss any questions you have with your health care provider.   Document Released: 09/09/2004 Document Revised: 08/23/2014 Document Reviewed: 02/26/2015 Elsevier Interactive Patient Education 2016 Elsevier Inc.  Contusion A contusion is a deep bruise. Contusions are the result of a blunt injury to tissues and muscle fibers under the skin. The injury causes bleeding under the skin. The skin overlying the contusion may turn blue, purple, or yellow. Minor injuries will give you a painless contusion, but more severe contusions may stay painful  and swollen for a few weeks.  CAUSES  This condition is usually caused by a blow, trauma, or direct force to an area of the body. SYMPTOMS  Symptoms of this condition include:  Swelling of the injured area.  Pain and tenderness in the injured area.  Discoloration. The area may have redness and then turn blue, purple, or yellow. DIAGNOSIS  This condition is diagnosed based on a physical exam and medical history. An X-ray, CT scan, or MRI may be needed to determine if there are any associated injuries, such as broken bones (fractures). TREATMENT  Specific treatment for this condition depends on what area of the body was injured. In general, the best treatment for a contusion is resting, icing, applying pressure to (compression), and elevating the injured area. This is often called the RICE strategy. Over-the-counter anti-inflammatory medicines may also be recommended for pain control.  HOME CARE INSTRUCTIONS   Rest the injured area.  If directed, apply ice to the injured area:  Put ice in a plastic bag.  Place a towel between your skin and the bag.  Leave the ice on for 20 minutes, 2-3 times per day.  If directed, apply light compression to the injured area using an elastic bandage. Make sure the bandage is not wrapped too tightly. Remove and reapply the bandage as directed by your health care provider.  If possible, raise (elevate) the injured area above the level of your heart while you are sitting or lying down.  Take over-the-counter and prescription medicines only as told by your health care provider. SEEK MEDICAL CARE IF:  Your symptoms do not improve after several days of treatment.  Your symptoms get worse.  You have difficulty moving the injured area. SEEK IMMEDIATE MEDICAL CARE IF:   You have severe pain.  You have numbness in a hand or foot.  Your hand or foot turns pale or cold.   This information is not intended to replace advice given to you by your health  care provider. Make sure you discuss any questions you have with your health care provider.   Document Released: 05/12/2005 Document Revised: 04/23/2015 Document Reviewed: 12/18/2014 Elsevier Interactive Patient Education [employer] Elsevier Inc.

## 2016-02-13 ENCOUNTER — Emergency Department
Admission: EM | Admit: 2016-02-13 | Discharge: 2016-02-13 | Disposition: A | Discharge status: Home / Self Care | Payer: Self-pay | Attending: Emergency Medicine | Admitting: Emergency Medicine

## 2016-02-13 ENCOUNTER — Encounter: Payer: Self-pay | Admitting: Emergency Medicine

## 2016-02-13 DIAGNOSIS — M5136 Other intervertebral disc degeneration, lumbar region: Secondary | ICD-10-CM | POA: Insufficient documentation

## 2016-02-13 DIAGNOSIS — G8929 Other chronic pain: Secondary | ICD-10-CM | POA: Insufficient documentation

## 2016-02-13 DIAGNOSIS — I10 Essential (primary) hypertension: Secondary | ICD-10-CM | POA: Insufficient documentation

## 2016-02-13 DIAGNOSIS — M549 Dorsalgia, unspecified: Secondary | ICD-10-CM | POA: Insufficient documentation

## 2016-02-13 DIAGNOSIS — F314 Bipolar disorder, current episode depressed, severe, without psychotic features: Secondary | ICD-10-CM | POA: Insufficient documentation

## 2016-02-13 DIAGNOSIS — Z8679 Personal history of other diseases of the circulatory system: Secondary | ICD-10-CM | POA: Insufficient documentation

## 2016-02-13 DIAGNOSIS — M5442 Lumbago with sciatica, left side: Secondary | ICD-10-CM | POA: Insufficient documentation

## 2016-02-13 DIAGNOSIS — Z79899 Other long term (current) drug therapy: Secondary | ICD-10-CM | POA: Insufficient documentation

## 2016-02-13 DIAGNOSIS — R7309 Other abnormal glucose: Secondary | ICD-10-CM | POA: Insufficient documentation

## 2016-02-13 DIAGNOSIS — F17210 Nicotine dependence, cigarettes, uncomplicated: Secondary | ICD-10-CM | POA: Insufficient documentation

## 2016-02-13 LAB — GLUCOSE, CAPILLARY: Glucose-Capillary: 201 mg/dL — ABNORMAL HIGH (ref 65–99)

## 2016-02-13 MED ORDER — HYDROCODONE-ACETAMINOPHEN 5-325 MG PO TABS: 1.0000 | ORAL_TABLET | ORAL | Status: DC | PRN

## 2016-02-13 MED ORDER — GABAPENTIN 100 MG PO CAPS: 100.0000 mg | ORAL_CAPSULE | Freq: Three times a day (TID) | ORAL | Status: DC

## 2016-02-13 NOTE — ED Notes (Signed)
States she is having lower back pain which radiates into left leg ambulates slowly to room. States she has degenerative disc and denies new injry  Usually takes gabapentin and sometime hydrocodone for pain but is currently out of meds has new appt on 7/12

## 2016-02-13 NOTE — Discharge Instructions (Signed)
Begin taking gabapentin 100 mg 3 times a day until you see your doctor. Also watch your diet in relationship to your sugar intake. Today your blood sugar was 201 nonfasting. This also needs to be addressed with your family doctor when you have your appointment. Norco as needed for severe pain. You are given a limited amount of medication as your doctor knees to prescribe narcotics for any continued use. You may also use moist heat or ice to her back to help with pain control.

## 2016-02-13 NOTE — ED Provider Notes (Signed)
[facility] Emergency Department Provider Note   ____________________________________________  Time seen: Approximately 11:59 AM  I have reviewed the triage vital signs and the nursing notes.   HISTORY  Chief Complaint Back Pain   HPI Robin Conway is a 50 y.o. female is here complaining of low back pain that radiates down into her left leg. She states that she has had chronic back pain for approximately 3 years but has experienced low back pain with sciatica for approximately 4 days. Currently she is out of her medications that her doctor in [place] generally prescribes for her. She is unable to see her doctor in [place] as her "card" has run out and she has not really needed it. She states she has appointment with her doctor on 7/12. She denies any incontinence of bowel or bladder. She states this feels like her normal sciatica. Patient continues to ambulate slowly seen due to pain. Really she rates her pain as 6/10. Patient states that this pain is not relieved with any positioning, ice or heator over-the-counter medication. Patient states that in the past she is taking gabapentin and hydrocodone.   Past Medical History  Diagnosis Date  . Mitral valve disorder   . Degenerative disc disease, lumbar   . Hypertension   . Bipolar 1 disorder, depressed, severe (HCC)     There are no active problems to display for this patient.   Past Surgical History  Procedure Laterality Date  . Tubal ligation    . Fracture surgery      Current Outpatient Rx  Name  Route  Sig  Dispense  Refill  . albuterol (PROVENTIL HFA;VENTOLIN HFA) 108 (90 BASE) MCG/ACT inhaler   Inhalation   Inhale 2 puffs into the lungs every 4 (four) hours as needed for wheezing or shortness of breath.   1 Inhaler   0   . gabapentin (NEURONTIN) 100 MG capsule   Oral   Take 1 capsule (100 mg total) by mouth 3 (three) times daily.   60 capsule   0   . HYDROcodone-acetaminophen  (NORCO/VICODIN) 5-325 MG tablet   Oral   Take 1 tablet by mouth every 4 (four) hours as needed for moderate pain.   20 tablet   0   . oxyCODONE-acetaminophen (ROXICET) 5-325 MG tablet   Oral   Take 1-2 tablets by mouth every 4 (four) hours as needed for severe pain.   15 tablet   0     Allergies Doxycycline; Propoxyphene; Sulfa antibiotics; and Penicillins  No family history on file.  Social History Social History  Substance Use Topics  . Smoking status: Current Every Day Smoker    Types: Cigarettes  . Smokeless tobacco: None  . Alcohol Use: No    Review of Systems Constitutional: No fever/chills Cardiovascular: Denies chest pain. Respiratory: Denies shortness of breath. Gastrointestinal: No abdominal pain.  No nausea, no vomiting.   Genitourinary: Negative for dysuria. Musculoskeletal: Positive for chronic back pain. Skin: Negative for rash. Neurological: Negative for headaches, focal weakness. Positive for left leg paresthesias.  10-point ROS otherwise negative.  ____________________________________________   PHYSICAL EXAM:  VITAL SIGNS: ED Triage Vitals  Enc Vitals Group     BP 02/13/16 1142 168/84 mmHg     Pulse Rate 02/13/16 1142 96     Resp 02/13/16 1142 20     Temp 02/13/16 1142 98.9 F (37.2 C)     Temp Source 02/13/16 1142 Oral     SpO2 02/13/16 1142 96 %  Weight 02/13/16 1142 211 lb (95.709 kg)     Height 02/13/16 1142 5\' 4"  (1.626 m)     Head Cir --      Peak Flow --      Pain Score 02/13/16 1142 6     Pain Loc --      Pain Edu? --      Excl. in GC? --     Constitutional: Alert and oriented. Well appearing and in no acute distress. Eyes: Conjunctivae are normal. PERRL. EOMI. Head: Atraumatic. Nose: No congestion/rhinnorhea. Neck: No stridor.   Cardiovascular: Normal rate, regular rhythm. Grossly normal heart sounds.  Good peripheral circulation. Respiratory: Normal respiratory effort.  No retractions. Lungs CTAB. Gastrointestinal:  Soft and nontender. No distention.  No CVA tenderness. Musculoskeletal: Moves upper and lower extremities without difficulty. On examination of the back there is no gross deformity. There is some tenderness on palpation of L5-S1 area and paravertebral muscles bilaterally. There is no active muscle spasm seen. Straight leg raises were approximate 90 with discomfort on the left leg. Patient continues to Without assistance. Neurologic:  Normal speech and language. No gross focal neurologic deficits are appreciated. No gait instability. Reflexes were 2+ bilaterally. Skin:  Skin is warm, dry and intact. No rash noted. Psychiatric: Mood and affect are normal. Speech and behavior are normal.  ____________________________________________   LABS (all labs ordered are listed, but only abnormal results are displayed)  Labs Reviewed  GLUCOSE, CAPILLARY - Abnormal; Notable for the following:    Glucose-Capillary 201 (*)    All other components within normal limits  CBG MONITORING, ED    PROCEDURES  Procedure(s) performed: None  Critical Care performed: No  ____________________________________________   INITIAL IMPRESSION / ASSESSMENT AND PLAN / ED COURSE  Pertinent labs & imaging results that were available during my care of the patient were reviewed by me and considered in my medical decision making (see chart for details).  Initially we discussed a tapering dose of steroids however there is strong family history of diabetes. Patient states that she has not been diagnosed with diabetes but her son also has diabetes and is currently being treated. Patient's blood sugar nonfasting was 201. Patient then discloses that she had 2 cups of coffee with sugar and to [facility] this [facility]. Patient was encouraged to follow up with her primary care doctor for recheck of her blood sugar. Patient was given a prescription for gabapentin 100 mg and Norco. Patient is to follow-up with her primary care  doctor or Dr. Joice LoftsPoggi if any continued problems with her back. ____________________________________________   FINAL CLINICAL IMPRESSION(S) / ED DIAGNOSES  Final diagnoses:  Bilateral low back pain with left-sided sciatica  Chronic back pain  Elevated glucose level      NEW MEDICATIONS STARTED DURING THIS VISIT:  Discharge Medication List as of 02/13/2016 12:43 PM    START taking these medications   Details  HYDROcodone-acetaminophen (NORCO/VICODIN) 5-325 MG tablet Take 1 tablet by mouth every 4 (four) hours as needed for moderate pain., Starting 02/13/2016, Until Discontinued, Print         Note:  This document was prepared using Dragon voice recognition software and may include unintentional dictation errors.    Tommi Rumpshonda L Summers, PA-C 02/13/16 1600  Jene Everyobert Kinner, MD 02/14/16 [phone]

## 2016-04-11 ENCOUNTER — Encounter: Payer: Self-pay | Admitting: Emergency Medicine

## 2016-04-11 ENCOUNTER — Emergency Department
Admission: EM | Admit: 2016-04-11 | Discharge: 2016-04-11 | Disposition: A | Discharge status: Home / Self Care | Payer: Self-pay | Attending: Emergency Medicine | Admitting: Emergency Medicine

## 2016-04-11 DIAGNOSIS — F17210 Nicotine dependence, cigarettes, uncomplicated: Secondary | ICD-10-CM | POA: Insufficient documentation

## 2016-04-11 DIAGNOSIS — I10 Essential (primary) hypertension: Secondary | ICD-10-CM | POA: Insufficient documentation

## 2016-04-11 DIAGNOSIS — N3001 Acute cystitis with hematuria: Secondary | ICD-10-CM | POA: Insufficient documentation

## 2016-04-11 DIAGNOSIS — G8929 Other chronic pain: Secondary | ICD-10-CM | POA: Insufficient documentation

## 2016-04-11 DIAGNOSIS — Z79899 Other long term (current) drug therapy: Secondary | ICD-10-CM | POA: Insufficient documentation

## 2016-04-11 LAB — URINALYSIS COMPLETE WITH MICROSCOPIC (ARMC ONLY)
BILIRUBIN URINE: NEGATIVE
Glucose, UA: NEGATIVE mg/dL
KETONES UR: NEGATIVE mg/dL
NITRITE: POSITIVE — AB
PH: 6 (ref 5.0–8.0)
PROTEIN: 30 mg/dL — AB
Specific Gravity, Urine: 1.015 (ref 1.005–1.030)

## 2016-04-11 MED ORDER — CIPROFLOXACIN HCL 500 MG PO TABS: 500.0000 mg | ORAL_TABLET | Freq: Two times a day (BID) | ORAL | 0 refills | Status: DC

## 2016-04-11 MED ORDER — HYDROCODONE-ACETAMINOPHEN 5-325 MG PO TABS: 1.0000 | ORAL_TABLET | ORAL | 0 refills | Status: DC | PRN

## 2016-04-11 MED ORDER — HYDROCODONE-ACETAMINOPHEN 5-325 MG PO TABS
1.0000 | ORAL_TABLET | Freq: Once | ORAL | Status: AC
  Administered 2016-04-11: 1 via ORAL
  Filled 2016-04-11: qty 1

## 2016-04-11 NOTE — Discharge Instructions (Signed)
Begin taking antibiotics today. Norco as needed for pain. A limited amount of pain medication is being written as you should call your primary care doctor for any continued pain management. Also call for referral to orthopedist. Also have urine rechecked after finishing the antibiotic to make sure it is completely cleared.

## 2016-04-11 NOTE — ED Triage Notes (Addendum)
C/O blood in urine x 5-6 days and low back pain.  Also c/o urinary frequency and urgency.  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

## 2016-04-11 NOTE — ED Provider Notes (Signed)
[facility] Emergency Department Provider Note   ____________________________________________   First MD Initiated Contact with Patient 04/11/16 1302     (approximate)  I have reviewed the triage vital signs and the nursing notes.   HISTORY  Chief Complaint Dysuria and Back Pain    HPI Robin Conway is a 50 y.o. female is here with complaint of hematuria for the last 5-6 days along with low back pain. Patient also complains of urinary frequency and urgency. She denies any fever, chills, nausea or vomiting. Patient has a history of urinary tract infections as well as back problems. Currently she is waiting for her primary care doctor to find an orthopedist who takes "charity care" for her low back pain. Her last urinary tract infection was over a year ago and she states there was no problem getting it to clear. She rates her pain is 7 out of 10.   Past Medical History:  Diagnosis Date  . Bipolar 1 disorder, depressed, severe (HCC)   . Degenerative disc disease, lumbar   . Hypertension   . Mitral valve disorder     There are no active problems to display for this patient.   Past Surgical History:  Procedure Laterality Date  . FRACTURE SURGERY    . TUBAL LIGATION      Prior to Admission medications   Medication Sig Start Date End Date Taking? Authorizing Provider  albuterol (PROVENTIL HFA;VENTOLIN HFA) 108 (90 BASE) MCG/ACT inhaler Inhale 2 puffs into the lungs every 4 (four) hours as needed for wheezing or shortness of breath. 07/15/15   Delorise Royals Cuthriell, PA-C  ciprofloxacin (CIPRO) 500 MG tablet Take 1 tablet (500 mg total) by mouth 2 (two) times daily. 04/11/16   Tommi Rumps, PA-C  gabapentin (NEURONTIN) 100 MG capsule Take 1 capsule (100 mg total) by mouth 3 (three) times daily. 02/13/16 02/12/17  Tommi Rumps, PA-C  HYDROcodone-acetaminophen (NORCO/VICODIN) 5-325 MG tablet Take 1 tablet by mouth every 4 (four) hours as needed for  moderate pain. 04/11/16   Tommi Rumps, PA-C    Allergies Doxycycline; Propoxyphene; Sulfa antibiotics; and Penicillins  No family history on file.  Social History Social History  Substance Use Topics  . Smoking status: Current Every Day Smoker    Types: Cigarettes  . Smokeless tobacco: Never Used  . Alcohol use No    Review of Systems Constitutional: No fever/chills Cardiovascular: Denies chest pain. Respiratory: Denies shortness of breath. Gastrointestinal:  No nausea, no vomiting.   Genitourinary:  Positive for hematuria, urgency and frequency. Musculoskeletal: Positive for chronic low back pain. Skin: Negative for rash. Neurological: Negative for headaches, focal weakness or numbness.  10-point ROS otherwise negative.  ____________________________________________   PHYSICAL EXAM:  VITAL SIGNS: ED Triage Vitals  Enc Vitals Group     BP 04/11/16 1232 (!) 146/84     Pulse Rate 04/11/16 1232 83     Resp 04/11/16 1232 16     Temp 04/11/16 1232 98.2 F (36.8 C)     Temp Source 04/11/16 1232 Oral     SpO2 04/11/16 1232 93 %     Weight 04/11/16 1232 211 lb (95.7 kg)     Height 04/11/16 1232 5\' 3"  (1.6 m)     Head Circumference --      Peak Flow --      Pain Score 04/11/16 1230 7     Pain Loc --      Pain Edu? --  Excl. in GC? --     Constitutional: Alert and oriented. Well appearing and in no acute distress. Eyes: Conjunctivae are normal. PERRL. EOMI. Head: Atraumatic. Nose: No congestion/rhinnorhea. Neck: No stridor.   Cardiovascular: Normal rate, regular rhythm. Grossly normal heart sounds.  Good peripheral circulation. Respiratory: Normal respiratory effort.  No retractions. Lungs CTAB. Gastrointestinal: Soft and nontender. No distention.  No CVA tenderness. Musculoskeletal: Tender to palpation sacral area and paravertebral muscles bilaterally. Gait was not tested secondary to patient's pain. Neurologic:  Normal speech and language. No gross focal  neurologic deficits are appreciated.  Skin:  Skin is warm, dry and intact. No rash noted. Psychiatric: Mood and affect are normal. Speech and behavior are normal.  ____________________________________________   LABS (all labs ordered are listed, but only abnormal results are displayed)  Labs Reviewed  URINALYSIS COMPLETEWITH MICROSCOPIC ([facility] ONLY) - Abnormal; Notable for the following:       Result Value   Color, Urine YELLOW (*)    APPearance HAZY (*)    Hgb urine dipstick 3+ (*)    Protein, ur 30 (*)    Nitrite POSITIVE (*)    Leukocytes, UA 1+ (*)    Bacteria, UA RARE (*)    Squamous Epithelial / LPF 6-30 (*)    All other components within normal limits  URINE CULTURE     PROCEDURES  Procedure(s) performed: None  Procedures  Critical Care performed: No  ____________________________________________   INITIAL IMPRESSION / ASSESSMENT AND PLAN / ED COURSE  Pertinent labs & imaging results that were available during my care of the patient were reviewed by me and considered in my medical decision making (see chart for details).    Clinical Course  Patient was given Norco while in the emergency room for back pain. She is to follow-up with her primary care doctor for referral to an orthopedist for her chronic back pain. Patient was placed on Cipro 500 mg twice a day for 7 days. She is to follow-up with her primary care for recheck of her urine after finishing this medication. We also discussed encouragement of increased fluids during this time.  ____________________________________________   FINAL CLINICAL IMPRESSION(S) / ED DIAGNOSES  Final diagnoses:  Acute cystitis with hematuria      NEW MEDICATIONS STARTED DURING THIS VISIT:  New Prescriptions   CIPROFLOXACIN (CIPRO) 500 MG TABLET    Take 1 tablet (500 mg total) by mouth 2 (two) times daily.   HYDROCODONE-ACETAMINOPHEN (NORCO/VICODIN) 5-325 MG TABLET    Take 1 tablet by mouth every 4 (four) hours as  needed for moderate pain.     Note:  This document was prepared using Dragon voice recognition software and may include unintentional dictation errors.    Tommi Rumpshonda L Almarie Kurdziel, PA-C 04/11/16 1403    Sharyn CreamerMark Quale, MD 04/11/16 [phone]

## 2016-04-13 ENCOUNTER — Emergency Department
Admission: EM | Admit: 2016-04-13 | Discharge: 2016-04-13 | Disposition: A | Discharge status: Home / Self Care | Payer: Self-pay | Attending: Emergency Medicine | Admitting: Emergency Medicine

## 2016-04-13 ENCOUNTER — Encounter: Payer: Self-pay | Admitting: Emergency Medicine

## 2016-04-13 DIAGNOSIS — N3001 Acute cystitis with hematuria: Secondary | ICD-10-CM | POA: Insufficient documentation

## 2016-04-13 DIAGNOSIS — I10 Essential (primary) hypertension: Secondary | ICD-10-CM | POA: Insufficient documentation

## 2016-04-13 DIAGNOSIS — F17210 Nicotine dependence, cigarettes, uncomplicated: Secondary | ICD-10-CM | POA: Insufficient documentation

## 2016-04-13 LAB — URINALYSIS COMPLETE WITH MICROSCOPIC (ARMC ONLY)
GLUCOSE, UA: NEGATIVE mg/dL
Ketones, ur: NEGATIVE mg/dL
NITRITE: POSITIVE — AB
Protein, ur: 30 mg/dL — AB
Specific Gravity, Urine: 1.032 — ABNORMAL HIGH (ref 1.005–1.030)
pH: 5 (ref 5.0–8.0)

## 2016-04-13 MED ORDER — LIDOCAINE HCL (PF) 1 % IJ SOLN
INTRAMUSCULAR | Status: DC
Start: 2016-04-13 — End: 2016-04-13
  Filled 2016-04-13: qty 5

## 2016-04-13 MED ORDER — NITROFURANTOIN MONOHYD MACRO 100 MG PO CAPS: 100.0000 mg | ORAL_CAPSULE | Freq: Two times a day (BID) | ORAL | 0 refills | Status: AC

## 2016-04-13 MED ORDER — OXYCODONE-ACETAMINOPHEN 5-325 MG PO TABS: 1.0000 | ORAL_TABLET | Freq: Four times a day (QID) | ORAL | 0 refills | Status: DC | PRN

## 2016-04-13 MED ORDER — ONDANSETRON HCL 4 MG PO TABS: 4.0000 mg | ORAL_TABLET | Freq: Three times a day (TID) | ORAL | 0 refills | Status: DC | PRN

## 2016-04-13 MED ORDER — CEFTRIAXONE SODIUM 1 G IJ SOLR
1.0000 g | Freq: Once | INTRAMUSCULAR | Status: AC
  Administered 2016-04-13: 1 g via INTRAMUSCULAR
  Filled 2016-04-13: qty 10

## 2016-04-13 MED ORDER — OXYCODONE-ACETAMINOPHEN 5-325 MG PO TABS
1.0000 | ORAL_TABLET | Freq: Once | ORAL | Status: AC
  Administered 2016-04-13: 1 via ORAL
  Filled 2016-04-13: qty 1

## 2016-04-13 NOTE — ED Provider Notes (Signed)
[facility] Emergency Department Provider Note  ____________________________________________  Time seen: Approximately 7:44 PM  I have reviewed the triage vital signs and the nursing notes.   HISTORY  Chief Complaint Urinary Tract Infection    HPI Robin Conway is a 50 y.o. female who reports to the emergency department for evaluation of a urinary tract infection. She states that she was here and given a prescription for Cipro. She has been unable to keep the Cipro down, even if she crushes it and puts it in applesauce. She states that the pain is getting worse and that she just feels bad all over. She has felt feverish, but has not checked her temperature. She denies chills. She denies vaginal discharge.  Past Medical History:  Diagnosis Date  . Bipolar 1 disorder, depressed, severe (HCC)   . Degenerative disc disease, lumbar   . Hypertension   . Mitral valve disorder     There are no active problems to display for this patient.   Past Surgical History:  Procedure Laterality Date  . FRACTURE SURGERY    . TUBAL LIGATION      Prior to Admission medications   Medication Sig Start Date End Date Taking? Authorizing Provider  albuterol (PROVENTIL HFA;VENTOLIN HFA) 108 (90 BASE) MCG/ACT inhaler Inhale 2 puffs into the lungs every 4 (four) hours as needed for wheezing or shortness of breath. 07/15/15   Delorise RoyalsJonathan D Cuthriell, PA-C  ciprofloxacin (CIPRO) 500 MG tablet Take 1 tablet (500 mg total) by mouth 2 (two) times daily. 04/11/16   Tommi Rumpshonda L Summers, PA-C  gabapentin (NEURONTIN) 100 MG capsule Take 1 capsule (100 mg total) by mouth 3 (three) times daily. 02/13/16 02/12/17  Tommi Rumpshonda L Summers, PA-C  HYDROcodone-acetaminophen (NORCO/VICODIN) 5-325 MG tablet Take 1 tablet by mouth every 4 (four) hours as needed for moderate pain. 04/11/16   Tommi Rumpshonda L Summers, PA-C  nitrofurantoin, macrocrystal-monohydrate, (MACROBID) 100 MG capsule Take 1 capsule (100 mg total) by  mouth 2 (two) times daily. 04/13/16 04/20/16  Chinita Pesterari B Maysun Meditz, FNP  ondansetron (ZOFRAN) 4 MG tablet Take 1 tablet (4 mg total) by mouth every 8 (eight) hours as needed for nausea or vomiting. 04/13/16   Chinita Pesterari B Domique Clapper, FNP  oxyCODONE-acetaminophen (ROXICET) 5-325 MG tablet Take 1 tablet by mouth every 6 (six) hours as needed. 04/13/16   Chinita Pesterari B Laurice Kimmons, FNP    Allergies Doxycycline; Propoxyphene; Sulfa antibiotics; and Penicillins  No family history on file.  Social History Social History  Substance Use Topics  . Smoking status: Current Every Day Smoker    Types: Cigarettes  . Smokeless tobacco: Never Used  . Alcohol use No    Review of Systems Constitutional: Questionable for fever. Respiratory: Negative for shortness of breath or cough. Gastrointestinal: Positive for abdominal pain; positive for nausea , positive for vomiting after taking Cipro. Genitourinary: Positive for dysuria , negative for vaginal discharge. Musculoskeletal: Positive for back pain. Skin: Warm, dry, intact. ____________________________________________   PHYSICAL EXAM:  VITAL SIGNS: ED Triage Vitals  Enc Vitals Group     BP 04/13/16 1900 (!) 155/87     Pulse Rate 04/13/16 1900 88     Resp 04/13/16 1900 16     Temp 04/13/16 1900 97.7 F (36.5 C)     Temp Source 04/13/16 1900 Oral     SpO2 04/13/16 1900 97 %     Weight 04/13/16 1902 211 lb (95.7 kg)     Height 04/13/16 1902 5\' 4"  (1.626 m)  Head Circumference --      Peak Flow --      Pain Score 04/13/16 1903 7     Pain Loc --      Pain Edu? --      Excl. in GC? --     Constitutional: Alert and oriented. Well appearing and in no acute distress. Eyes: Conjunctivae are normal. PERRL. EOMI. Head: Atraumatic. Nose: No congestion/rhinnorhea. Mouth/Throat: Mucous membranes are moist. Respiratory: Normal respiratory effort.  No retractions. Gastrointestinal: Suprapubic tenderness on exam, otherwise abdomen is soft and nontender Genitourinary:  Pelvic exam: Deferred Musculoskeletal: No extremity tenderness nor edema.  Neurologic:  Normal speech and language. No gross focal neurologic deficits are appreciated. Speech is normal. No gait instability. Skin:  Skin is warm, dry and intact. No rash noted. Psychiatric: Mood and affect are normal. Speech and behavior are normal.  ____________________________________________   LABS (all labs ordered are listed, but only abnormal results are displayed)  Labs Reviewed  URINALYSIS COMPLETEWITH MICROSCOPIC ([facility] ONLY) - Abnormal; Notable for the following:       Result Value   Color, Urine YELLOW (*)    APPearance HAZY (*)    Bilirubin Urine 1+ (*)    Specific Gravity, Urine 1.032 (*)    Hgb urine dipstick 3+ (*)    Protein, ur 30 (*)    Nitrite POSITIVE (*)    Leukocytes, UA 1+ (*)    Bacteria, UA MANY (*)    Squamous Epithelial / LPF 6-30 (*)    All other components within normal limits   ____________________________________________  RADIOLOGY  Not indicated ____________________________________________   PROCEDURES  Procedure(s) performed: None  ____________________________________________   INITIAL IMPRESSION / ASSESSMENT AND PLAN / ED COURSE  Clinical Course   Urine culture shows gram-negative rods, but the bio gram is not yet available. IM Rocephin was given while in the emergency department.    Pertinent labs & imaging results that were available during my care of the patient were reviewed by me and considered in my medical decision making (see chart for details).   Patient will be given prescriptions for Macrobid, Percocet, and Zofran today. She was advised to follow up with her primary care provider in [place] for symptoms that are not improving over to 3 days. She was advised to return to the ER for symptoms that change or worsen if unable to schedule an appointment. ____________________________________________   FINAL CLINICAL IMPRESSION(S) / ED  DIAGNOSES  Final diagnoses:  Acute cystitis with hematuria    Note:  This document was prepared using Dragon voice recognition software and may include unintentional dictation errors.    Chinita Pester, FNP 04/13/16 2022    Arnaldo Natal, MD 04/13/16 [phone]

## 2016-04-13 NOTE — ED Triage Notes (Signed)
Seen through ED 8/27 and placed on Cipro for UTI.  Patient states she is not getting any better.  C/O nausea and vomiting with Cipro and UTI symptoms have not improved.

## 2016-04-17 LAB — URINE CULTURE: Special Requests: NORMAL

## 2016-05-11 ENCOUNTER — Emergency Department: Payer: Self-pay

## 2016-05-11 ENCOUNTER — Emergency Department
Admission: EM | Admit: 2016-05-11 | Discharge: 2016-05-11 | Disposition: A | Discharge status: Home / Self Care | Payer: Self-pay | Attending: Emergency Medicine | Admitting: Emergency Medicine

## 2016-05-11 DIAGNOSIS — W010XXA Fall on same level from slipping, tripping and stumbling without subsequent striking against object, initial encounter: Secondary | ICD-10-CM | POA: Insufficient documentation

## 2016-05-11 DIAGNOSIS — G8929 Other chronic pain: Secondary | ICD-10-CM | POA: Insufficient documentation

## 2016-05-11 DIAGNOSIS — Y990 Civilian activity done for income or pay: Secondary | ICD-10-CM | POA: Insufficient documentation

## 2016-05-11 DIAGNOSIS — M5441 Lumbago with sciatica, right side: Secondary | ICD-10-CM | POA: Insufficient documentation

## 2016-05-11 DIAGNOSIS — Y9389 Activity, other specified: Secondary | ICD-10-CM | POA: Insufficient documentation

## 2016-05-11 DIAGNOSIS — I10 Essential (primary) hypertension: Secondary | ICD-10-CM | POA: Insufficient documentation

## 2016-05-11 DIAGNOSIS — F17210 Nicotine dependence, cigarettes, uncomplicated: Secondary | ICD-10-CM | POA: Insufficient documentation

## 2016-05-11 DIAGNOSIS — S7001XA Contusion of right hip, initial encounter: Secondary | ICD-10-CM | POA: Insufficient documentation

## 2016-05-11 DIAGNOSIS — M549 Dorsalgia, unspecified: Secondary | ICD-10-CM

## 2016-05-11 DIAGNOSIS — M5431 Sciatica, right side: Secondary | ICD-10-CM

## 2016-05-11 DIAGNOSIS — Y9269 Other specified industrial and construction area as the place of occurrence of the external cause: Secondary | ICD-10-CM | POA: Insufficient documentation

## 2016-05-11 DIAGNOSIS — Z79899 Other long term (current) drug therapy: Secondary | ICD-10-CM | POA: Insufficient documentation

## 2016-05-11 MED ORDER — CYCLOBENZAPRINE HCL 10 MG PO TABS: 10.0000 mg | ORAL_TABLET | Freq: Three times a day (TID) | ORAL | 0 refills | Status: DC | PRN

## 2016-05-11 MED ORDER — PREDNISONE 10 MG PO TABS: ORAL_TABLET | ORAL | 0 refills | Status: DC

## 2016-05-11 MED ORDER — ONDANSETRON 4 MG PO TBDP
4.0000 mg | ORAL_TABLET | Freq: Once | ORAL | Status: AC
  Administered 2016-05-11: 4 mg via ORAL
  Filled 2016-05-11: qty 1

## 2016-05-11 MED ORDER — KETOROLAC TROMETHAMINE 30 MG/ML IJ SOLN
30.0000 mg | Freq: Once | INTRAMUSCULAR | Status: AC
  Administered 2016-05-11: 30 mg via INTRAMUSCULAR
  Filled 2016-05-11: qty 1

## 2016-05-11 NOTE — Discharge Instructions (Signed)
Ice to the affected areas x 20 minutes 3-4 times daily for the first 48 hours then may switch to heat.  Please see your Primary care provider for further pain management.

## 2016-05-11 NOTE — ED Provider Notes (Signed)
[facility] Emergency Department Provider Note  ____________________________________________  Time seen: Approximately 10:29 AM  I have reviewed the triage vital signs and the nursing notes.   HISTORY  Chief Complaint Back Pain    HPI Robin Conway is a 50 y.o. female , NAD, presents to the emergency department with one-day history of right lower back and hip pain. Patient states she fell while at work yesterday. States she was helping moving a palate of batteries that seemed to be broken. States that the pallet broke and knocked her down. States she fell onto her right side. Has had pain in her right lower back and hip since that time. States she has chronic degenerative disc in the lumbar spine and has chronic pain and sciatica associated with such but states it's worse today. Denies any numbness, weakness, tingling. Has not had any saddle paresthesias or loss of bowel or bladder control. Denies any redness, abnormal warmth, swelling, bruising, open wounds or lacerations. Denies any head injury, neck pain. Has not lost consciousness nor had any dizziness, lightheadedness or headache. Patient does note she has been nauseated today without emesis. Denies abdominal pain, chest pain or shortness of breath. Patient is declining filing Worker's compensation today.   Past Medical History:  Diagnosis Date  . Bipolar 1 disorder, depressed, severe (HCC)   . Degenerative disc disease, lumbar   . Hypertension   . Mitral valve disorder     There are no active problems to display for this patient.   Past Surgical History:  Procedure Laterality Date  . FRACTURE SURGERY    . TUBAL LIGATION      Prior to Admission medications   Medication Sig Start Date End Date Taking? Authorizing Provider  albuterol (PROVENTIL HFA;VENTOLIN HFA) 108 (90 BASE) MCG/ACT inhaler Inhale 2 puffs into the lungs every 4 (four) hours as needed for wheezing or shortness of breath. 07/15/15    Delorise Royals Cuthriell, PA-C  cyclobenzaprine (FLEXERIL) 10 MG tablet Take 1 tablet (10 mg total) by mouth 3 (three) times daily as needed for muscle spasms. 05/11/16   Khaila Velarde L Odesser Tourangeau, PA-C  gabapentin (NEURONTIN) 100 MG capsule Take 1 capsule (100 mg total) by mouth 3 (three) times daily. 02/13/16 02/12/17  Tommi Rumps, PA-C  oxyCODONE-acetaminophen (ROXICET) 5-325 MG tablet Take 1 tablet by mouth every 6 (six) hours as needed. 04/13/16   Chinita Pester, FNP  predniSONE (DELTASONE) 10 MG tablet Take a daily regimen of 6,5,4,3,2,1 05/11/16   Skyleigh Windle L Tajah Noguchi, PA-C    Allergies Doxycycline; Propoxyphene; Sulfa antibiotics; and Penicillins  No family history on file.  Social History Social History  Substance Use Topics  . Smoking status: Current Every Day Smoker    Types: Cigarettes  . Smokeless tobacco: Never Used  . Alcohol use No     Review of Systems  Constitutional: No fatigue Eyes: No visual changes.  Cardiovascular: No chest pain. Respiratory: No shortness of breath.  Gastrointestinal: Positive nausea but no vomiting. No abdominal pain.   Musculoskeletal: Positive for back and right hip pain. No neck pain Skin: Negative for rash, redness, swelling, abnormal warmth, bruising, open wounds or lacerations. Neurological: Negative for numbness, weakness, tingling. No saddle paresthesias nor loss of bowel or bladder control. 10-point ROS otherwise negative.  ____________________________________________   PHYSICAL EXAM:  VITAL SIGNS: ED Triage Vitals  Enc Vitals Group     BP 05/11/16 1011 (!) 154/90     Pulse Rate 05/11/16 1011 86  Resp 05/11/16 1011 16     Temp 05/11/16 1011 97.8 F (36.6 C)     Temp Source 05/11/16 1011 Oral     SpO2 05/11/16 1011 98 %     Weight 05/11/16 1012 211 lb (95.7 kg)     Height 05/11/16 1012 5\' 4"  (1.626 m)     Head Circumference --      Peak Flow --      Pain Score 05/11/16 1012 7     Pain Loc --      Pain Edu? --      Excl. in GC? --       Constitutional: Alert and oriented. Well appearing and in no acute distress. Eyes: Conjunctivae are normal without icterus or injection. PERRL. EOMI without pain.  Head: Atraumatic. Neck: Supple with full range of motion. No cervical spine tenderness to palpation. Hematological/Lymphatic/Immunilogical: No cervical lymphadenopathy. Cardiovascular: Normal rate, regular rhythm. Normal S1 and S2.  Good peripheral circulation. Respiratory: Normal respiratory effort without tachypnea or retractions. Lungs CTAB. Gastrointestinal: Soft and nontender. No distention. No CVA tenderness. Musculoskeletal: No tenderness to palpation about the thoracic, lumbar or sacral spinal areas centrally without step offs or bony abnormalities. Moderate tenderness to palpation of the right paraspinal region with SI joint tenderness on the right with deep palpation.  Full range of motion of bilateral upper and lower extremities without pain or difficulty. No lower extremity tenderness nor edema.  No joint effusions. Neurologic:  Normal speech and language. No gross focal neurologic deficits are appreciated. Incisional light touch grossly intact to bilateral upper and lower extremities Skin:  Skin is warm, dry and intact. No rash, redness, swelling, abnormal warmth, bruising, open wounds or lacerations noted. Psychiatric: Mood and affect are normal. Speech and behavior are normal. Patient exhibits appropriate insight and judgement.   ____________________________________________   LABS  None ____________________________________________  EKG  None ____________________________________________  RADIOLOGY I have personally viewed and evaluated these images (plain radiographs) as part of my medical decision making, as well as reviewing the written report by the radiologist. Dg Lumbar Spine 2-3 Views  Result Date: 05/11/2016 CLINICAL DATA:  Larey Seat yesterday at work with right-sided pain EXAM: LUMBAR SPINE - 2-3 VIEW  COMPARISON:  Lumbar spine films of 01/19/2016 FINDINGS: The lumbar vertebrae remain in normal alignment. Intervertebral disc spaces appear normal. No new compression deformity is seen. Moderate abdominal aortic atherosclerosis is present. The SI joints are corticated. IMPRESSION: Normal alignment with no acute abnormality. Moderate abdominal aortic atherosclerosis. Electronically Signed   By: Dwyane Dee M.D.   On: 05/11/2016 11:10   Dg Hip Unilat With Pelvis 2-3 Views Right  Result Date: 05/11/2016 CLINICAL DATA:  Larey Seat at work yesterday with right-sided pain EXAM: DG HIP (WITH OR WITHOUT PELVIS) 2-3V RIGHT COMPARISON:  Sacrum and coccyx use of 01/19/2016 FINDINGS: There are only mild degenerative changes within the hips with some superior acetabular spurring present. No acute fracture is seen. The pelvic rami are intact. The SI joints are corticated. There are degenerative changes at L5-S1 noted. IMPRESSION: No acute abnormality. Only mild degenerative change of the hips. Degenerative change of L5-S1 is noted as well. Electronically Signed   By: Dwyane Dee M.D.   On: 05/11/2016 11:14    ____________________________________________    PROCEDURES  Procedure(s) performed: None   Procedures   Medications  ketorolac (TORADOL) 30 MG/ML injection 30 mg (30 mg Intramuscular Given 05/11/16 1055)  ondansetron (ZOFRAN-ODT) disintegrating tablet 4 mg (4 mg Oral Given 05/11/16 1055)  ____________________________________________   INITIAL IMPRESSION / ASSESSMENT AND PLAN / ED COURSE  Pertinent labs & imaging results that were available during my care of the patient were reviewed by me and considered in my medical decision making (see chart for details).  Clinical Course    Patient's diagnosis is consistent with Contusion of right hip, sciatica of right side and chronic back pain. Patient will be discharged home with prescriptions for prednisone and Flexeril to take as directed. Patient is  advised to discontinue use of all over-the-counter NSAIDs while on prednisone. Patient is to follow up with her primary care provider if symptoms persist past this treatment course or she needs further pain management. Patient is given ED precautions to return to the ED for any worsening or new symptoms.    ____________________________________________  FINAL CLINICAL IMPRESSION(S) / ED DIAGNOSES  Final diagnoses:  Contusion of right hip, initial encounter  Sciatica of right side  Chronic back pain      NEW MEDICATIONS STARTED DURING THIS VISIT:  Discharge Medication List as of 05/11/2016 11:50 AM    START taking these medications   Details  cyclobenzaprine (FLEXERIL) 10 MG tablet Take 1 tablet (10 mg total) by mouth 3 (three) times daily as needed for muscle spasms., Starting Tue 05/11/2016, Print    predniSONE (DELTASONE) 10 MG tablet Take a daily regimen of 6,5,4,3,2,1, Print             Hope PigeonJami L Valaria Kohut, PA-C 05/11/16 1157    Sharman CheekPhillip Stafford, MD 05/11/16 1529

## 2016-05-11 NOTE — ED Triage Notes (Signed)
Pt states she was moving a pallet of batteries yesterday at work and it fell over knocking her down, pt c/o right hip/lower back pain.. Pt works at [employer]

## 2016-05-11 NOTE — ED Notes (Signed)
See triage note  States she tripped over a pallet yesterday   The batteries shifted  Having pain to right hip and lower back  Ambulates with sl limp

## 2016-05-16 ENCOUNTER — Encounter: Payer: Self-pay | Admitting: Emergency Medicine

## 2016-05-16 ENCOUNTER — Emergency Department
Admission: EM | Admit: 2016-05-16 | Discharge: 2016-05-16 | Disposition: A | Discharge status: Home / Self Care | Payer: Self-pay | Attending: Emergency Medicine | Admitting: Emergency Medicine

## 2016-05-16 DIAGNOSIS — N3091 Cystitis, unspecified with hematuria: Secondary | ICD-10-CM | POA: Insufficient documentation

## 2016-05-16 DIAGNOSIS — Z79899 Other long term (current) drug therapy: Secondary | ICD-10-CM | POA: Insufficient documentation

## 2016-05-16 DIAGNOSIS — R3129 Other microscopic hematuria: Secondary | ICD-10-CM

## 2016-05-16 DIAGNOSIS — I10 Essential (primary) hypertension: Secondary | ICD-10-CM | POA: Insufficient documentation

## 2016-05-16 DIAGNOSIS — F17210 Nicotine dependence, cigarettes, uncomplicated: Secondary | ICD-10-CM | POA: Insufficient documentation

## 2016-05-16 DIAGNOSIS — N3090 Cystitis, unspecified without hematuria: Secondary | ICD-10-CM

## 2016-05-16 LAB — URINALYSIS COMPLETE WITH MICROSCOPIC (ARMC ONLY)
BILIRUBIN URINE: NEGATIVE
Glucose, UA: NEGATIVE mg/dL
KETONES UR: NEGATIVE mg/dL
Nitrite: NEGATIVE
Protein, ur: 30 mg/dL — AB
Specific Gravity, Urine: 1.016 (ref 1.005–1.030)
pH: 7 (ref 5.0–8.0)

## 2016-05-16 MED ORDER — NITROFURANTOIN MONOHYD MACRO 100 MG PO CAPS: 100.0000 mg | ORAL_CAPSULE | Freq: Two times a day (BID) | ORAL | 0 refills | Status: AC

## 2016-05-16 MED ORDER — ACETAMINOPHEN-CODEINE #3 300-30 MG PO TABS: 1.0000 | ORAL_TABLET | Freq: Four times a day (QID) | ORAL | 0 refills | Status: DC | PRN

## 2016-05-16 MED ORDER — PHENAZOPYRIDINE HCL 200 MG PO TABS
200.0000 mg | ORAL_TABLET | Freq: Once | ORAL | Status: AC
  Administered 2016-05-16: 200 mg via ORAL
  Filled 2016-05-16: qty 1

## 2016-05-16 MED ORDER — PHENAZOPYRIDINE HCL 200 MG PO TABS: 200.0000 mg | ORAL_TABLET | Freq: Three times a day (TID) | ORAL | 0 refills | Status: DC | PRN

## 2016-05-16 MED ORDER — ACETAMINOPHEN-CODEINE #3 300-30 MG PO TABS
1.0000 | ORAL_TABLET | Freq: Once | ORAL | Status: AC
  Administered 2016-05-16: 1 via ORAL
  Filled 2016-05-16: qty 1

## 2016-05-16 MED ORDER — NITROFURANTOIN MONOHYD MACRO 100 MG PO CAPS
100.0000 mg | ORAL_CAPSULE | Freq: Once | ORAL | Status: AC
  Administered 2016-05-16: 100 mg via ORAL
  Filled 2016-05-16: qty 1

## 2016-05-16 NOTE — ED Triage Notes (Signed)
States blood in urine x 1 week. Had this happen 5 weeks ago and states was from uti

## 2016-05-16 NOTE — Discharge Instructions (Signed)
Take the prescription meds as directed. Take the antibiotic as directed until all pills are gone. Follow-up with your primary MD or gynecologist for further treatment. Empty your bladder before, and immediately after intercourse.

## 2016-05-17 LAB — URINE CULTURE: Special Requests: NORMAL

## 2016-05-18 NOTE — Progress Notes (Signed)
CONSULT NOTE   Pharmacy Consult for ED Culture reports   Allergies  Allergen Reactions  . Doxycycline Swelling  . Propoxyphene Other (See Comments)    darvocet  . Sulfa Antibiotics Swelling  . Penicillins Rash   Patient Measurements: Height: 5\' 4"  (162.6 cm) Weight: 211 lb (95.7 kg) IBW/kg (Calculated) : 54.7  Microbiology: Recent Results (from the past 720 hour(s))  Urine culture     Status: Abnormal   Collection Time: 05/16/16 10:56 AM  Result Value Ref Range Status   Specimen Description URINE, CLEAN CATCH  Final   Special Requests Normal  Final   Culture (A)  Final    >=100,000 COLONIES/mL GROUP B STREP(S.AGALACTIAE)ISOLATED TESTING AGAINST S. AGALACTIAE NOT ROUTINELY PERFORMED DUE TO PREDICTABILITY OF AMP/PEN/VAN SUSCEPTIBILITY. Virtually 100% of S. agalactiae (Group B) strains are susceptible to Penicillin.  For Penicillin-allergic patients, Erythromycin (85-95% sensitive) and Clindamycin (80% sensitive) are drugs of choice. Contact microbiology lab to request sensitivities if  needed within 7 days. [facility]    Report Status 05/17/2016 FINAL  Final    Assessment: 50 yo female seen in ED on 05/16/2016 for UTI symptoms. Patient was discharged on nitrofurantoin 100mg  BID.  Culture results show Group B Strep (S. AGALACTIAE)  >100,000 colonies/mL. Patients has allergies to PCN, Sulfa, and Doxycycline.   Plan:  After discussion with Dr. Darnelle CatalanMalinda, patient's Abx will be changed to Clindamycin 300mg  TID x7 days. Spoke to patient at 1545 and explained new Abx instructions and to discontinue other Abx. Patient verbalized understanding. [facility] on [address] at [address] (patient's pharmacy of choice).   Cher NakaiSheema Barret Esquivel, PharmD Clinical Pharmacist 05/18/2016 4:06 PM

## 2016-05-20 NOTE — ED Provider Notes (Signed)
[facility] Emergency Department Provider Note ____________________________________________  Time seen: 1118  I have reviewed the triage vital signs and the nursing notes.  HISTORY  Chief Complaint  Hematuria  HPI Robin Conway is a 50 y.o. female presents to the ED for evaluation of gross hematuria for the last week.Patient also reports some dysuria over the last few days. She describes 5 weeks ago she was treated for urinary tract infection and did complete her prescription at that time. She denies any fevers, chills, sweats. She also denies any abnormal vaginal bleeding or vaginal discharge.  Past Medical History:  Diagnosis Date  . Bipolar 1 disorder, depressed, severe (HCC)   . Degenerative disc disease, lumbar   . Hypertension   . Mitral valve disorder     There are no active problems to display for this patient.   Past Surgical History:  Procedure Laterality Date  . FRACTURE SURGERY    . TUBAL LIGATION      Prior to Admission medications   Medication Sig Start Date End Date Taking? Authorizing Provider  acetaminophen-codeine (TYLENOL #3) 300-30 MG tablet Take 1 tablet by mouth every 6 (six) hours as needed for moderate pain. 05/16/16   Zoha Spranger V Bacon Calyse Murcia, PA-C  albuterol (PROVENTIL HFA;VENTOLIN HFA) 108 (90 BASE) MCG/ACT inhaler Inhale 2 puffs into the lungs every 4 (four) hours as needed for wheezing or shortness of breath. 07/15/15   Delorise RoyalsJonathan D Cuthriell, PA-C  cyclobenzaprine (FLEXERIL) 10 MG tablet Take 1 tablet (10 mg total) by mouth 3 (three) times daily as needed for muscle spasms. 05/11/16   Jami L Hagler, PA-C  gabapentin (NEURONTIN) 100 MG capsule Take 1 capsule (100 mg total) by mouth 3 (three) times daily. 02/13/16 02/12/17  Tommi Rumpshonda L Summers, PA-C  nitrofurantoin, macrocrystal-monohydrate, (MACROBID) 100 MG capsule Take 1 capsule (100 mg total) by mouth 2 (two) times daily. 05/16/16 05/23/16  Ramin Zoll V Bacon Eben Choinski, PA-C   oxyCODONE-acetaminophen (ROXICET) 5-325 MG tablet Take 1 tablet by mouth every 6 (six) hours as needed. 04/13/16   Chinita Pesterari B Triplett, FNP  phenazopyridine (PYRIDIUM) 200 MG tablet Take 1 tablet (200 mg total) by mouth 3 (three) times daily as needed for pain. 05/16/16   Brissia Delisa V Bacon Mickel Schreur, PA-C  predniSONE (DELTASONE) 10 MG tablet Take a daily regimen of 6,5,4,3,2,1 05/11/16   Jami L Hagler, PA-C    Allergies Doxycycline; Propoxyphene; Sulfa antibiotics; and Penicillins  History reviewed. No pertinent family history.  Social History Social History  Substance Use Topics  . Smoking status: Current Every Day Smoker    Packs/day: 0.50    Types: Cigarettes  . Smokeless tobacco: Never Used  . Alcohol use No    Review of Systems  Constitutional: Negative for fever. Cardiovascular: Negative for chest pain. Respiratory: Negative for shortness of breath. Gastrointestinal: Negative for abdominal pain, vomiting and diarrhea. Genitourinary: Positive for dysuria and hematuria. Musculoskeletal: Negative for back pain. Skin: Negative for rash. Neurological: Negative for headaches, focal weakness or numbness. ____________________________________________  PHYSICAL EXAM:  VITAL SIGNS: ED Triage Vitals  Enc Vitals Group     BP 05/16/16 1054 (!) 141/83     Pulse Rate 05/16/16 1054 87     Resp 05/16/16 1054 15     Temp 05/16/16 1054 97.7 F (36.5 C)     Temp Source 05/16/16 1054 Oral     SpO2 05/16/16 1054 94 %     Weight 05/16/16 1104 211 lb (95.7 kg)     Height 05/16/16 1104  5\' 4"  (1.626 m)     Head Circumference --      Peak Flow --      Pain Score 05/16/16 1104 7     Pain Loc --      Pain Edu? --      Excl. in GC? --    Constitutional: Alert and oriented. Well appearing and in no distress. Head: Normocephalic and atraumatic. Cardiovascular: Normal rate, regular rhythm.  Respiratory: Normal respiratory effort. No wheezes/rales/rhonchi. Gastrointestinal: Soft and nontender. No  distention. No CVA tenderness. Musculoskeletal: Nontender with normal range of motion in all extremities.  Neurologic:  Normal gait without ataxia. Normal speech and language. No gross focal neurologic deficits are appreciated. Skin:  Skin is warm, dry and intact. No rash noted. ____________________________________________   LABS (pertinent positives/negatives) Labs Reviewed  URINE CULTURE - Abnormal; Notable for the following:       Result Value   Culture   (*)    Value: >=100,000 COLONIES/mL GROUP B STREP(S.AGALACTIAE)ISOLATED TESTING AGAINST S. AGALACTIAE NOT ROUTINELY PERFORMED DUE TO PREDICTABILITY OF AMP/PEN/VAN SUSCEPTIBILITY. Virtually 100% of S. agalactiae (Group B) strains are susceptible to Penicillin.  For Penicillin-allergic patients, Erythromycin (85-95% sensitive) and Clindamycin (80% sensitive) are drugs of choice. Contact microbiology lab to request sensitivities if  needed within 7 days. Performed at [facility]    All other components within normal limits  URINALYSIS COMPLETEWITH MICROSCOPIC ([facility] ONLY) - Abnormal; Notable for the following:    Color, Urine YELLOW (*)    APPearance CLOUDY (*)    Hgb urine dipstick 3+ (*)    Protein, ur 30 (*)    Leukocytes, UA TRACE (*)    Bacteria, UA RARE (*)    Squamous Epithelial / LPF 6-30 (*)    All other components within normal limits  ____________________________________________  PROCEDURES  Tylenol #3 Macrobid 100 mg PO Pyridium 200 mg PO ____________________________________________  INITIAL IMPRESSION / ASSESSMENT AND PLAN / ED COURSE  Patient with an acute cystitis and hematuria. She is discharged with a prescription for Tylenol No. 3, Macrobid, and Pyridium to dose as directed. She will follow with primary care provider for ongoing or worsening symptoms. Urine culture is pending at the time of discharge.  Clinical Course   ____________________________________________  FINAL CLINICAL IMPRESSION(S) /  ED DIAGNOSES  Final diagnoses:  Other microscopic hematuria  Cystitis      Lissa Hoard, PA-C 05/20/16 0040    Governor Rooks, MD 05/21/16 1359

## 2016-06-06 ENCOUNTER — Emergency Department
Admission: EM | Admit: 2016-06-06 | Discharge: 2016-06-06 | Disposition: A | Discharge status: Home / Self Care | Payer: Self-pay | Attending: Emergency Medicine | Admitting: Emergency Medicine

## 2016-06-06 ENCOUNTER — Emergency Department: Payer: Self-pay

## 2016-06-06 ENCOUNTER — Encounter: Payer: Self-pay | Admitting: Emergency Medicine

## 2016-06-06 DIAGNOSIS — Z79899 Other long term (current) drug therapy: Secondary | ICD-10-CM | POA: Insufficient documentation

## 2016-06-06 DIAGNOSIS — J069 Acute upper respiratory infection, unspecified: Secondary | ICD-10-CM | POA: Insufficient documentation

## 2016-06-06 DIAGNOSIS — I10 Essential (primary) hypertension: Secondary | ICD-10-CM | POA: Insufficient documentation

## 2016-06-06 DIAGNOSIS — J209 Acute bronchitis, unspecified: Secondary | ICD-10-CM | POA: Insufficient documentation

## 2016-06-06 DIAGNOSIS — F17210 Nicotine dependence, cigarettes, uncomplicated: Secondary | ICD-10-CM | POA: Insufficient documentation

## 2016-06-06 MED ORDER — AZITHROMYCIN 250 MG PO TABS: ORAL_TABLET | ORAL | 0 refills | Status: DC

## 2016-06-06 MED ORDER — IPRATROPIUM-ALBUTEROL 0.5-2.5 (3) MG/3ML IN SOLN
3.0000 mL | Freq: Once | RESPIRATORY_TRACT | Status: AC
  Administered 2016-06-06: 3 mL via RESPIRATORY_TRACT
  Filled 2016-06-06: qty 3

## 2016-06-06 MED ORDER — BENZONATATE 100 MG PO CAPS: 100.0000 mg | ORAL_CAPSULE | Freq: Three times a day (TID) | ORAL | 0 refills | Status: DC

## 2016-06-06 MED ORDER — PREDNISONE 20 MG PO TABS
60.0000 mg | ORAL_TABLET | Freq: Once | ORAL | Status: AC
  Administered 2016-06-06: 60 mg via ORAL
  Filled 2016-06-06: qty 3

## 2016-06-06 NOTE — Discharge Instructions (Signed)
Obtain your prednisone from the drugstore and begin taking it as directed. Tessalon as needed for cough and Zithromax as directed. Increase fluids. Follow-up with your primary care doctor if any continued problems.

## 2016-06-06 NOTE — ED Provider Notes (Signed)
[facility] Emergency Department Provider Note   ____________________________________________   First MD Initiated Contact with Patient 06/06/16 1003     (approximate)  I have reviewed the triage vital signs and the nursing notes.   HISTORY  Chief Complaint Cough and Nasal Congestion    HPI Robin Conway is a 50 y.o. female is here with complaint of cough congestion for the last 2-3 days. Patient states that she began noticing that she was wheezing. She also has a lot of nasal congestion which she cannot breathe through her nose and has been mouth breathing for this about the same time. She denies any previous history of asthma but states that her son had asthma as a child and outgrew it. Patient is a smoker approximately one half pack cigarettes per day. Patient states she has not been able to smoke since she has been sick. She is unaware of any fever or chills. She states she has had a history of bronchitis. She denies any other respiratory symptoms, denies nausea, vomiting, or diarrhea. Patient has not taken any over-the-counter medication. Currently she rates her pain as 4 out of 10 which is mainly due to coughing.   Past Medical History:  Diagnosis Date  . Bipolar 1 disorder, depressed, severe (HCC)   . Degenerative disc disease, lumbar   . Hypertension   . Mitral valve disorder     There are no active problems to display for this patient.   Past Surgical History:  Procedure Laterality Date  . FRACTURE SURGERY    . TUBAL LIGATION      Prior to Admission medications   Medication Sig Start Date End Date Taking? Authorizing Provider  acetaminophen-codeine (TYLENOL #3) 300-30 MG tablet Take 1 tablet by mouth every 6 (six) hours as needed for moderate pain. 05/16/16   Jenise V Bacon Menshew, PA-C  albuterol (PROVENTIL HFA;VENTOLIN HFA) 108 (90 BASE) MCG/ACT inhaler Inhale 2 puffs into the lungs every 4 (four) hours as needed for wheezing or  shortness of breath. 07/15/15   Delorise Royals Cuthriell, PA-C  azithromycin (ZITHROMAX Z-PAK) 250 MG tablet Take 2 tablets (500 mg) on  Day 1,  followed by 1 tablet (250 mg) once daily on Days 2 through 5. 06/06/16   Tommi Rumps, PA-C  benzonatate (TESSALON PERLES) 100 MG capsule Take 1 capsule (100 mg total) by mouth 3 (three) times daily. 06/06/16 06/06/17  Tommi Rumps, PA-C  cyclobenzaprine (FLEXERIL) 10 MG tablet Take 1 tablet (10 mg total) by mouth 3 (three) times daily as needed for muscle spasms. 05/11/16   Jami L Hagler, PA-C  gabapentin (NEURONTIN) 100 MG capsule Take 1 capsule (100 mg total) by mouth 3 (three) times daily. 02/13/16 02/12/17  Tommi Rumps, PA-C  oxyCODONE-acetaminophen (ROXICET) 5-325 MG tablet Take 1 tablet by mouth every 6 (six) hours as needed. 04/13/16   Chinita Pester, FNP  phenazopyridine (PYRIDIUM) 200 MG tablet Take 1 tablet (200 mg total) by mouth 3 (three) times daily as needed for pain. 05/16/16   Jenise V Bacon Menshew, PA-C  predniSONE (DELTASONE) 10 MG tablet Take a daily regimen of 6,5,4,3,2,1 05/11/16   Jami L Hagler, PA-C    Allergies Doxycycline; Propoxyphene; Sulfa antibiotics; and Penicillins  History reviewed. No pertinent family history.  Social History Social History  Substance Use Topics  . Smoking status: Current Every Day Smoker    Packs/day: 0.50    Types: Cigarettes  . Smokeless tobacco: Never Used  . Alcohol  use No    Review of Systems Constitutional: No fever/chills Eyes: No visual changes. ENT: No sore throat. Cardiovascular: Denies chest pain. Respiratory: Denies shortness of breath.  Positive wheezing. Gastrointestinal: No abdominal pain.  No nausea, no vomiting.  No diarrhea.   Skin: Negative for rash. Neurological: Negative for headaches, focal weakness or numbness.  10-point ROS otherwise negative.  ____________________________________________   PHYSICAL EXAM:  VITAL SIGNS: ED Triage Vitals  Enc Vitals  Group     BP 06/06/16 0952 127/67     Pulse Rate 06/06/16 0952 80     Resp 06/06/16 0952 20     Temp 06/06/16 0952 97.9 F (36.6 C)     Temp Source 06/06/16 0952 Oral     SpO2 06/06/16 0952 97 %     Weight 06/06/16 0953 198 lb (89.8 kg)     Height 06/06/16 0953 5\' 4"  (1.626 m)     Head Circumference --      Peak Flow --      Pain Score 06/06/16 0953 4     Pain Loc --      Pain Edu? --      Excl. in GC? --     Constitutional: Alert and oriented. Well appearing and in no acute distress. Eyes: Conjunctivae are normal. PERRL. EOMI. Head: Atraumatic. Nose: Moderate congestion/no rhinnorhea.  EACs and TMs are clear bilaterally. Mouth/Throat: Mucous membranes are moist.  Oropharynx non-erythematous. Neck: No stridor.   Hematological/Lymphatic/Immunilogical: No cervical lymphadenopathy. Cardiovascular: Normal rate, regular rhythm. Grossly normal heart sounds.  Good peripheral circulation. Respiratory: Normal respiratory effort.  No retractions. Lungs bilaterally with expiratory wheeze and coarse cough. Patient is able to talk in complete sentences without any difficulty. Gastrointestinal: Soft and nontender. No distention. Musculoskeletal: Moves upper and lower extremities without any difficulty. Normal gait was noted. Neurologic:  Normal speech and language. No gross focal neurologic deficits are appreciated. No gait instability. Skin:  Skin is warm, dry and intact. No rash noted. Psychiatric: Mood and affect are normal. Speech and behavior are normal.  ____________________________________________   LABS (all labs ordered are listed, but only abnormal results are displayed)  Labs Reviewed - No data to display ____________________________________________  RADIOLOGY  Chest x-ray per radiology was negative for cardiopulmonary disease. I, Tommi Rumps, personally viewed and evaluated these images (plain radiographs) as part of my medical decision making, as well as reviewing the  written report by the radiologist. ____________________________________________   PROCEDURES  Procedure(s) performed: None  Procedures  Critical Care performed: No  ____________________________________________   INITIAL IMPRESSION / ASSESSMENT AND PLAN / ED COURSE  Pertinent labs & imaging results that were available during my care of the patient were reviewed by me and considered in my medical decision making (see chart for details).    Clinical Course  Patient states that she has a prescription for prednisone at her pharmacy that her PCP had written with 1 refill on it. She is certain that this refill is not outdated. Patient was given a prescription for [facility] as needed for cough and a Z-Pak. Patient states that she is unable to afford inhaler and does not want a prescription for one. She is to follow-up with her PCP if any continued problems.   ____________________________________________   FINAL CLINICAL IMPRESSION(S) / ED DIAGNOSES  Final diagnoses:  Acute bronchitis, unspecified organism  Acute upper respiratory infection      NEW MEDICATIONS STARTED DURING THIS VISIT:  Discharge Medication List as of 06/06/2016 12:03 PM  START taking these medications   Details  azithromycin (ZITHROMAX Z-PAK) 250 MG tablet Take 2 tablets (500 mg) on  Day 1,  followed by 1 tablet (250 mg) once daily on Days 2 through 5., Print    benzonatate (TESSALON PERLES) 100 MG capsule Take 1 capsule (100 mg total) by mouth 3 (three) times daily., Starting Sun 06/06/2016, Until Mon 06/06/2017, Print         Note:  This document was prepared using Dragon voice recognition software and may include unintentional dictation errors.    Tommi RumpsRhonda L Summers, PA-C 06/06/16 1245    Governor Rooksebecca Lord, MD 06/06/16 [id number]

## 2016-06-06 NOTE — ED Triage Notes (Signed)
Patient arrived to [facility] ED with c/o cough and congestion for 2-3 days

## 2016-06-27 ENCOUNTER — Encounter: Payer: Self-pay | Admitting: Emergency Medicine

## 2016-06-27 ENCOUNTER — Emergency Department
Admission: EM | Admit: 2016-06-27 | Discharge: 2016-06-27 | Disposition: A | Discharge status: Home / Self Care | Payer: Self-pay | Attending: Student in an Organized Health Care Education/Training Program | Admitting: Student in an Organized Health Care Education/Training Program

## 2016-06-27 DIAGNOSIS — G8929 Other chronic pain: Secondary | ICD-10-CM | POA: Insufficient documentation

## 2016-06-27 DIAGNOSIS — Z79899 Other long term (current) drug therapy: Secondary | ICD-10-CM | POA: Insufficient documentation

## 2016-06-27 DIAGNOSIS — X500XXA Overexertion from strenuous movement or load, initial encounter: Secondary | ICD-10-CM | POA: Insufficient documentation

## 2016-06-27 DIAGNOSIS — Y9389 Activity, other specified: Secondary | ICD-10-CM | POA: Insufficient documentation

## 2016-06-27 DIAGNOSIS — Z792 Long term (current) use of antibiotics: Secondary | ICD-10-CM | POA: Insufficient documentation

## 2016-06-27 DIAGNOSIS — I10 Essential (primary) hypertension: Secondary | ICD-10-CM | POA: Insufficient documentation

## 2016-06-27 DIAGNOSIS — Y990 Civilian activity done for income or pay: Secondary | ICD-10-CM | POA: Insufficient documentation

## 2016-06-27 DIAGNOSIS — Y929 Unspecified place or not applicable: Secondary | ICD-10-CM | POA: Insufficient documentation

## 2016-06-27 DIAGNOSIS — F17210 Nicotine dependence, cigarettes, uncomplicated: Secondary | ICD-10-CM | POA: Insufficient documentation

## 2016-06-27 DIAGNOSIS — M545 Low back pain: Secondary | ICD-10-CM | POA: Insufficient documentation

## 2016-06-27 MED ORDER — PREDNISONE 10 MG PO TABS: 10.0000 mg | ORAL_TABLET | Freq: Every day | ORAL | 0 refills | Status: DC

## 2016-06-27 MED ORDER — CYCLOBENZAPRINE HCL 5 MG PO TABS: 5.0000 mg | ORAL_TABLET | Freq: Three times a day (TID) | ORAL | 0 refills | Status: DC | PRN

## 2016-06-27 NOTE — ED Triage Notes (Signed)
Presents with lower back pain States she has a hx of disc problems  Ambulates well to triage area

## 2016-06-27 NOTE — ED Provider Notes (Signed)
[facility]-EMERGENCY DEPARTMENT Provider Note   CSN: [id number] Arrival date & time: 06/27/16  [id number]     History   Chief Complaint Chief Complaint  Patient presents with  . Back Pain    HPI Robin Conway is a 50 y.o. female presents to the emergency department for evaluation of acute on chronic lower back pain. Patient states she's had chronic back pain that has flared up over the last week. Patient states she recently began working 7 days a week, performing a lot of walking standing and lifting. Last night her pain was 7 out of 10 in the lower back, midline of the lumbar spine. She has tried topical medicated patches with no relief. She is also taking ibuprofen with no improvement. She is on Neurontin for sciatica symptoms and states she is actually having no flareup of her sciatica symptoms and is only midline lower back pain. She denies any weakness in the lower extremities. No abdominal pain. No urinary symptoms. Pain is increased with standing and bending.  HPI  Past Medical History:  Diagnosis Date  . Bipolar 1 disorder, depressed, severe (HCC)   . Degenerative disc disease, lumbar   . Hypertension   . Mitral valve disorder     There are no active problems to display for this patient.   Past Surgical History:  Procedure Laterality Date  . FRACTURE SURGERY    . TUBAL LIGATION      OB History    No data available       Home Medications    Prior to Admission medications   Medication Sig Start Date End Date Taking? Authorizing Provider  acetaminophen-codeine (TYLENOL #3) 300-30 MG tablet Take 1 tablet by mouth every 6 (six) hours as needed for moderate pain. 05/16/16   Jenise V Bacon Menshew, PA-C  albuterol (PROVENTIL HFA;VENTOLIN HFA) 108 (90 BASE) MCG/ACT inhaler Inhale 2 puffs into the lungs every 4 (four) hours as needed for wheezing or shortness of breath. 07/15/15   Delorise RoyalsJonathan D Cuthriell, PA-C  azithromycin (ZITHROMAX Z-PAK) 250 MG tablet Take 2 tablets (500 mg)  on  Day 1,  followed by 1 tablet (250 mg) once daily on Days 2 through 5. 06/06/16   Tommi Rumpshonda L Summers, PA-C  benzonatate (TESSALON PERLES) 100 MG capsule Take 1 capsule (100 mg total) by mouth 3 (three) times daily. 06/06/16 06/06/17  Tommi Rumpshonda L Summers, PA-C  cyclobenzaprine (FLEXERIL) 5 MG tablet Take 1-2 tablets (5-10 mg total) by mouth 3 (three) times daily as needed for muscle spasms. 06/27/16   Evon Slackhomas C Hyla Coard, PA-C  gabapentin (NEURONTIN) 100 MG capsule Take 1 capsule (100 mg total) by mouth 3 (three) times daily. 02/13/16 02/12/17  Tommi Rumpshonda L Summers, PA-C  oxyCODONE-acetaminophen (ROXICET) 5-325 MG tablet Take 1 tablet by mouth every 6 (six) hours as needed. 04/13/16   Chinita Pesterari B Triplett, FNP  phenazopyridine (PYRIDIUM) 200 MG tablet Take 1 tablet (200 mg total) by mouth 3 (three) times daily as needed for pain. 05/16/16   Jenise V Bacon Menshew, PA-C  predniSONE (DELTASONE) 10 MG tablet Take 1 tablet (10 mg total) by mouth daily. 6,5,4,3,2,1 six day taper 06/27/16   Evon Slackhomas C Elzy Tomasello, PA-C    Family History No family history on file.  Social History Social History  Substance Use Topics  . Smoking status: Current Every Day Smoker    Packs/day: 0.50    Types: Cigarettes  . Smokeless tobacco: Never Used  . Alcohol use No     Allergies   Doxycycline;  Propoxyphene; Sulfa antibiotics; and Penicillins   Review of Systems Review of Systems  Constitutional: Negative for activity change, chills, fatigue and fever.  HENT: Negative for congestion, sinus pressure and sore throat.   Eyes: Negative for visual disturbance.  Respiratory: Negative for cough, chest tightness and shortness of breath.   Cardiovascular: Negative for chest pain and leg swelling.  Gastrointestinal: Negative for abdominal pain, diarrhea, nausea and vomiting.  Genitourinary: Negative for dysuria, flank pain and frequency.  Musculoskeletal: Positive for back pain. Negative for arthralgias and gait problem.  Skin: Negative for  rash.  Neurological: Negative for weakness, numbness and headaches.  Hematological: Negative for adenopathy.  Psychiatric/Behavioral: Negative for agitation, behavioral problems and confusion.     Physical Exam Updated Vital Signs BP (!) 162/94 (BP Location: Right Arm)   Pulse 72   Temp 97.9 F (36.6 C) (Oral)   Resp 15   SpO2 95%   Physical Exam  Constitutional: She is oriented to person, place, and time. She appears well-developed and well-nourished. No distress.  HENT:  Head: Normocephalic and atraumatic.  Mouth/Throat: Oropharynx is clear and moist.  Eyes: EOM are normal. Pupils are equal, round, and reactive to light. Right eye exhibits no discharge. Left eye exhibits no discharge.  Neck: Normal range of motion. Neck supple.  Cardiovascular: Normal rate, regular rhythm and intact distal pulses.   Pulmonary/Chest: Effort normal and breath sounds normal. No respiratory distress. She has no wheezes. She has no rales. She exhibits no tenderness.  Abdominal: Soft. She exhibits no distension and no mass. There is no tenderness. There is no rebound and no guarding.  Musculoskeletal: Normal range of motion. She exhibits no edema.  Lumbar Spine: Examination of the lumbar spine reveals no bony abnormality, no edema, and no ecchymosis.  There is no step off.  The patient has full range of motion of the lumbar spine with flexion and extension.  The patient has normal lateral bend and rotation.  The patient has no pain with range of motion activities.  The patient is non tender along the spinous process at the lumbosacral junction.  There is tenderness along the left and right paravertebral muscles of the lumbar spine at the lumbosacral junction.  The patient is non tender along the iliac crest.  The patient is non tender in the sciatic notch.  The patient is non tender along the Sacroiliac joint.  There is no Coccyx joint tenderness.    Bilateral Lower Extremities: Examination of the lower  extremities reveals no bony abnormality, no edema, and no ecchymosis.  The patient has full active and passive range of motion of the hips, knees, and ankles.  There is no discomfort with range of motion exercises.  The patient is non tender along the greater trochanter region.  The patient has a negative Denna Haggard' test bilaterally.  There is normal skin warmth.  There is normal capillary refill bilaterally.    Neurologic: The patient has a negative straight leg raise.  The patient has normal muscle strength testing for the quadriceps, calves, ankle dorsiflexion, ankle plantarflexion, and extensor hallicus longus.  The patient has sensation that is intact to light touch. Normal patella deep tendon reflexes bilaterally.  Neurological: She is alert and oriented to person, place, and time. She has normal reflexes.  Skin: Skin is warm and dry.  Psychiatric: She has a normal mood and affect. Her behavior is normal. Thought content normal.     ED Treatments / Results  Labs (all labs ordered are listed,  but only abnormal results are displayed) Labs Reviewed - No data to display  EKG  EKG Interpretation None       Radiology No results found.  Procedures Procedures (including critical care time)  Medications Ordered in ED Medications - No data to display   Initial Impression / Assessment and Plan / ED Course  I have reviewed the triage vital signs and the nursing notes.  Pertinent labs & imaging results that were available during my care of the patient were reviewed by me and considered in my medical decision making (see chart for details).  Clinical Course     50 year old female with nontraumatic acute on chronic lower back pain. She's been working 7 days a week with her job performing a lot of standing and lifting. Her pain is in the midline of her lumbar spine along the paravertebral muscles of the lumbosacral junction. Abdominal exam is normal. No urinary symptoms. She has a history  of sciatica but this is not currently flared up. She is placed on a 6 day steroid taper, given prescription for Flexeril. She is given a note for work for 2 days. She will follow-up with orthopedics. Return to the ER for any worsening symptoms urgent changes in her health.  Final Clinical Impressions(s) / ED Diagnoses   Final diagnoses:  Chronic midline low back pain without sciatica    New Prescriptions New Prescriptions   CYCLOBENZAPRINE (FLEXERIL) 5 MG TABLET    Take 1-2 tablets (5-10 mg total) by mouth 3 (three) times daily as needed for muscle spasms.   PREDNISONE (DELTASONE) 10 MG TABLET    Take 1 tablet (10 mg total) by mouth daily. 6,5,4,3,2,1 six day taper     Evon Slackhomas C Natausha Jungwirth, PA-C 06/27/16 1023    Willy EddyPatrick Robinson, MD 06/27/16 1431

## 2016-06-27 NOTE — ED Notes (Signed)

## 2016-06-27 NOTE — Discharge Instructions (Signed)
Please rest and avoid activities that are causing lower back pain. Use a heating pad for additional pain relief. Take 6 day steroid taper and muscle relaxers as prescribed. Follow-up with orthopedics for recheck of a lower back in 1 week. Return to the ER for any worsening symptoms or urgent changes in her health.

## 2016-07-05 ENCOUNTER — Emergency Department
Admission: EM | Admit: 2016-07-05 | Discharge: 2016-07-05 | Disposition: A | Discharge status: Home / Self Care | Payer: Self-pay | Attending: Emergency Medicine | Admitting: Emergency Medicine

## 2016-07-05 DIAGNOSIS — F17210 Nicotine dependence, cigarettes, uncomplicated: Secondary | ICD-10-CM | POA: Insufficient documentation

## 2016-07-05 DIAGNOSIS — M545 Low back pain, unspecified: Secondary | ICD-10-CM

## 2016-07-05 DIAGNOSIS — I10 Essential (primary) hypertension: Secondary | ICD-10-CM | POA: Insufficient documentation

## 2016-07-05 DIAGNOSIS — G8929 Other chronic pain: Secondary | ICD-10-CM | POA: Insufficient documentation

## 2016-07-05 DIAGNOSIS — F319 Bipolar disorder, unspecified: Secondary | ICD-10-CM | POA: Insufficient documentation

## 2016-07-05 MED ORDER — METHOCARBAMOL 500 MG PO TABS: ORAL_TABLET | ORAL | 0 refills | Status: DC

## 2016-07-05 MED ORDER — TRAMADOL HCL 50 MG PO TABS: 50.0000 mg | ORAL_TABLET | Freq: Four times a day (QID) | ORAL | 0 refills | Status: DC | PRN

## 2016-07-05 MED ORDER — KETOROLAC TROMETHAMINE 30 MG/ML IJ SOLN
30.0000 mg | Freq: Once | INTRAMUSCULAR | Status: DC
  Filled 2016-07-05: qty 1

## 2016-07-05 MED ORDER — KETOROLAC TROMETHAMINE 30 MG/ML IJ SOLN: 30.0000 mg | Freq: Once | INTRAMUSCULAR | Status: DC

## 2016-07-05 NOTE — ED Triage Notes (Signed)
Pt c/o lower back pain for the past 2 weeks, states she was seen here recently and prescribed steroids and some other meds without relief.

## 2016-07-05 NOTE — Discharge Instructions (Signed)
Call your doctor for any continued medication. Get prednisone filled today. Continue your Neurontin as directed by your primary care doctor. Begin taking Robaxin as directed for muscle spasms. Continue meloxicam once a day. Tramadol as needed for pain. You cannot take these medications while driving or operating machinery as both could cause drowsiness. Keep your appointment with the orthopedist. Call the office to see if they can see you sooner than the middle of December or to check and see if there is been any cancellations.

## 2016-07-05 NOTE — ED Provider Notes (Signed)
[facility] Emergency Department Provider Note   ____________________________________________   First MD Initiated Contact with Patient 07/05/16 1036     (approximate)  I have reviewed the triage vital signs and the nursing notes.   HISTORY  Chief Complaint Back Pain    HPI Robin Conway is a 50 y.o. female is here complaining of low back pain for the last 2 weeks. She was seen in the emergency room on 06/27/16 for low back pain. At that time she was given a prescription for Flexeril and prednisone. Patient was told to make an appointment with the orthopedic Department. Patient is back today stating that the prednisone did not help. She states that last week she saw her primary care in [place] who wrote another prescription for prednisone that she is to get filled today. Patient denies any new injury but states that the back pain is the same that she has been experiencing chronically. She denies any urinary symptoms, paresthesias, incontinence of bowel or bladder, or inability to ambulate. Currently she rates her pain as an 8 out of 10. She states she continues to take her routine medication including gabapentin as prescribed by her PCP. Patient states that she has an appointment with the orthopedist the second week in December.   Past Medical History:  Diagnosis Date  . Bipolar 1 disorder, depressed, severe (HCC)   . Degenerative disc disease, lumbar   . Hypertension   . Mitral valve disorder     There are no active problems to display for this patient.   Past Surgical History:  Procedure Laterality Date  . FRACTURE SURGERY    . TUBAL LIGATION      Prior to Admission medications   Medication Sig Start Date End Date Taking? Authorizing Provider  albuterol (PROVENTIL HFA;VENTOLIN HFA) 108 (90 BASE) MCG/ACT inhaler Inhale 2 puffs into the lungs every 4 (four) hours as needed for wheezing or shortness of breath. 07/15/15   Delorise RoyalsJonathan D Cuthriell,  PA-C  gabapentin (NEURONTIN) 100 MG capsule Take 1 capsule (100 mg total) by mouth 3 (three) times daily. 02/13/16 02/12/17  Tommi Rumpshonda L Summers, PA-C  methocarbamol (ROBAXIN) 500 MG tablet Take one or 2 tablets every 6 hours as needed for muscle spasms. 07/05/16   Tommi Rumpshonda L Summers, PA-C  traMADol (ULTRAM) 50 MG tablet Take 1 tablet (50 mg total) by mouth every 6 (six) hours as needed. 07/05/16 07/05/17  Tommi Rumpshonda L Summers, PA-C    Allergies Doxycycline; Propoxyphene; Sulfa antibiotics; and Penicillins  No family history on file.  Social History Social History  Substance Use Topics  . Smoking status: Current Every Day Smoker    Packs/day: 0.50    Types: Cigarettes  . Smokeless tobacco: Never Used  . Alcohol use No    Review of Systems Constitutional: No fever/chills ENT: No sore throat. Cardiovascular: Denies chest pain. Respiratory: Denies shortness of breath. Gastrointestinal: No abdominal pain.  No nausea, no vomiting.  No diarrhea.  No constipation. Genitourinary: Negative for dysuria. Musculoskeletal: Positive for chronic low back pain. Skin: Negative for rash. Neurological: Negative for headaches, focal weakness or numbness. Psychiatric:Positive bipolar disorder.  10-point ROS otherwise negative.  ____________________________________________   PHYSICAL EXAM:  VITAL SIGNS: ED Triage Vitals  Enc Vitals Group     BP 07/05/16 1027 (!) 151/81     Pulse Rate 07/05/16 1027 92     Resp 07/05/16 1027 18     Temp 07/05/16 1027 97.8 F (36.6 C)     Temp  Source 07/05/16 1027 Oral     SpO2 07/05/16 1027 97 %     Weight 07/05/16 1027 211 lb (95.7 kg)     Height 07/05/16 1027 5\' 4"  (1.626 m)     Head Circumference --      Peak Flow --      Pain Score 07/05/16 1028 8     Pain Loc --      Pain Edu? --      Excl. in GC? --     Constitutional: Alert and oriented. Well appearing and in no acute distress. Eyes: Conjunctivae are normal. PERRL. EOMI. Head: Atraumatic. Nose: No  congestion/rhinnorhea. Neck: No stridor.   Cardiovascular: Normal rate, regular rhythm. Grossly normal heart sounds.  Good peripheral circulation. Respiratory: Normal respiratory effort.  No retractions. Lungs CTAB. Gastrointestinal: Soft and nontender. No distention.  No CVA tenderness. Musculoskeletal: On examination of the back there is no gross deformity noted. Patient has full range of motion without any active muscle spasms noted. There is no point tenderness on palpation of the lumbar spine however there is tenderness on palpation of the paravertebral muscles surrounding L5-S1 area. There is no soft tissue edema, ecchymosis or erythema present. Straight leg raises were negative. Patient is able to flex and extend without any difficulty. Neurologic:  Normal speech and language. No gross focal neurologic deficits are appreciated. Reflexes were 1+ bilaterally. Patient was ambulatory without assistance. Skin:  Skin is warm, dry and intact. No ecchymosis, abrasions, erythema or edema of the lower extremities. Psychiatric: Mood and affect are normal. Speech and behavior are normal.  ____________________________________________   LABS (all labs ordered are listed, but only abnormal results are displayed)  Labs Reviewed - No data to display   PROCEDURES  Procedure(s) performed: None  Procedures  Critical Care performed: No  ____________________________________________   INITIAL IMPRESSION / ASSESSMENT AND PLAN / ED COURSE  Pertinent labs & imaging results that were available during my care of the patient were reviewed by me and considered in my medical decision making (see chart for details).    Clinical Course    Patient states that she has been taking prednisone and that her primary care doctor has called in another course of penicillin for her at her drugstore [facility].  Patient states that the pain today she is unable to tolerate until she sees the orthopedist in December.  Pharmacy was called and reported that the patient has frequently wanted to get her gabapentin filled earlier then the 30 day period. He has not filled a prescription for prednisone for this patient in over a year however patient could have gotten the prescription filled at another pharmacy. All other prescriptions were from her primary care doctor in [place]. Patient is to follow-up with her PCP for any continued medication until she can see the orthopedist.  ____________________________________________   FINAL CLINICAL IMPRESSION(S) / ED DIAGNOSES  Final diagnoses:  Chronic bilateral low back pain without sciatica      NEW MEDICATIONS STARTED DURING THIS VISIT:  Discharge Medication List as of 07/05/2016 12:16 PM    START taking these medications   Details  methocarbamol (ROBAXIN) 500 MG tablet Take one or 2 tablets every 6 hours as needed for muscle spasms., Print    traMADol (ULTRAM) 50 MG tablet Take 1 tablet (50 mg total) by mouth every 6 (six) hours as needed., Starting Mon 07/05/2016, Until Tue 07/05/2017, Print         Note:  This document was prepared using Dragon  voice recognition software and may include unintentional dictation errors.    Tommi Rumps, PA-C 07/05/16 1545    Jene Every, MD 07/10/16 [phone]

## 2016-07-30 ENCOUNTER — Ambulatory Visit (INDEPENDENT_AMBULATORY_CARE_PROVIDER_SITE_OTHER): Payer: Self-pay

## 2016-07-30 ENCOUNTER — Ambulatory Visit (HOSPITAL_COMMUNITY)
Admission: EM | Admit: 2016-07-30 | Discharge: 2016-07-30 | Disposition: A | Discharge status: Home / Self Care | Payer: Self-pay | Attending: Internal Medicine | Admitting: Internal Medicine

## 2016-07-30 ENCOUNTER — Encounter (HOSPITAL_COMMUNITY): Payer: Self-pay | Admitting: Emergency Medicine

## 2016-07-30 DIAGNOSIS — M545 Low back pain, unspecified: Secondary | ICD-10-CM | POA: Diagnosis present

## 2016-07-30 DIAGNOSIS — W19XXXA Unspecified fall, initial encounter: Secondary | ICD-10-CM

## 2016-07-30 MED ORDER — KETOROLAC TROMETHAMINE 30 MG/ML IJ SOLN
60.0000 mg | Freq: Once | INTRAMUSCULAR | Status: AC
  Administered 2016-07-30: 60 mg via INTRAMUSCULAR

## 2016-07-30 MED ORDER — KETOROLAC TROMETHAMINE 60 MG/2ML IM SOLN
INTRAMUSCULAR | Status: AC
Start: 2016-07-30 — End: 2016-07-30
  Filled 2016-07-30: qty 2

## 2016-07-30 NOTE — ED Triage Notes (Signed)
[facility] a [facility] 4 days ago. The patient stated that she is having low back pain and has a hx of DDD
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
## 2016-07-30 NOTE — Discharge Instructions (Signed)
Avoid heavy lifting, continue taking previously prescribed medications of Mobic, Robaxin, and Tramadol for pain relief. Should symptoms continue, obtain and follow up with primary care provider, or return to clinic.

## 2016-09-16 ENCOUNTER — Emergency Department
Admission: EM | Admit: 2016-09-16 | Discharge: 2016-09-17 | Disposition: A | Discharge status: Home / Self Care | Payer: Self-pay | Attending: Emergency Medicine | Admitting: Emergency Medicine

## 2016-09-16 DIAGNOSIS — R102 Pelvic and perineal pain: Secondary | ICD-10-CM

## 2016-09-16 DIAGNOSIS — I10 Essential (primary) hypertension: Secondary | ICD-10-CM | POA: Insufficient documentation

## 2016-09-16 DIAGNOSIS — Z79899 Other long term (current) drug therapy: Secondary | ICD-10-CM | POA: Insufficient documentation

## 2016-09-16 DIAGNOSIS — R52 Pain, unspecified: Secondary | ICD-10-CM

## 2016-09-16 DIAGNOSIS — N938 Other specified abnormal uterine and vaginal bleeding: Secondary | ICD-10-CM | POA: Insufficient documentation

## 2016-09-16 DIAGNOSIS — N760 Acute vaginitis: Secondary | ICD-10-CM | POA: Insufficient documentation

## 2016-09-16 DIAGNOSIS — F17210 Nicotine dependence, cigarettes, uncomplicated: Secondary | ICD-10-CM | POA: Insufficient documentation

## 2016-09-16 DIAGNOSIS — B9689 Other specified bacterial agents as the cause of diseases classified elsewhere: Secondary | ICD-10-CM

## 2016-09-16 LAB — URINALYSIS, COMPLETE (UACMP) WITH MICROSCOPIC
Bilirubin Urine: NEGATIVE
GLUCOSE, UA: NEGATIVE mg/dL
Ketones, ur: NEGATIVE mg/dL
NITRITE: NEGATIVE
Protein, ur: NEGATIVE mg/dL
SPECIFIC GRAVITY, URINE: 1.006 (ref 1.005–1.030)
pH: 6 (ref 5.0–8.0)

## 2016-09-16 LAB — BASIC METABOLIC PANEL
Anion gap: 9 (ref 5–15)
BUN: 9 mg/dL (ref 6–20)
CALCIUM: 8.4 mg/dL — AB (ref 8.9–10.3)
CHLORIDE: 106 mmol/L (ref 101–111)
CO2: 23 mmol/L (ref 22–32)
CREATININE: 0.8 mg/dL (ref 0.44–1.00)
Glucose, Bld: 123 mg/dL — ABNORMAL HIGH (ref 65–99)
Potassium: 3.7 mmol/L (ref 3.5–5.1)
SODIUM: 138 mmol/L (ref 135–145)

## 2016-09-16 LAB — CBC
HCT: 39.7 % (ref 35.0–47.0)
Hemoglobin: 13.2 g/dL (ref 12.0–16.0)
MCH: 28.4 pg (ref 26.0–34.0)
MCHC: 33.1 g/dL (ref 32.0–36.0)
MCV: 85.8 fL (ref 80.0–100.0)
PLATELETS: 309 10*3/uL (ref 150–440)
RBC: 4.63 MIL/uL (ref 3.80–5.20)
RDW: 15.1 % — AB (ref 11.5–14.5)
WBC: 11.5 10*3/uL — ABNORMAL HIGH (ref 3.6–11.0)

## 2016-09-16 NOTE — ED Triage Notes (Signed)
Pt in with co lower abd pain and vaginal bleeding for 3 weeks, has hx of uterine fibroids. Had not had cycle for 3 years prior to this event.

## 2016-09-17 ENCOUNTER — Emergency Department: Payer: Self-pay

## 2016-09-17 LAB — CHLAMYDIA/NGC RT PCR (ARMC ONLY)
Chlamydia Tr: NOT DETECTED
N GONORRHOEAE: NOT DETECTED

## 2016-09-17 LAB — WET PREP, GENITAL
Sperm: NONE SEEN
Trich, Wet Prep: NONE SEEN
YEAST WET PREP: NONE SEEN

## 2016-09-17 MED ORDER — OXYCODONE-ACETAMINOPHEN 5-325 MG PO TABS
1.0000 | ORAL_TABLET | Freq: Once | ORAL | Status: AC
  Administered 2016-09-17: 1 via ORAL
  Filled 2016-09-17: qty 1

## 2016-09-17 MED ORDER — ONDANSETRON 4 MG PO TBDP
4.0000 mg | ORAL_TABLET | Freq: Once | ORAL | Status: AC
  Administered 2016-09-17: 4 mg via ORAL
  Filled 2016-09-17: qty 1

## 2016-09-17 MED ORDER — METRONIDAZOLE 500 MG PO TABS: 500.0000 mg | ORAL_TABLET | Freq: Two times a day (BID) | ORAL | 0 refills | Status: DC

## 2016-09-17 MED ORDER — IBUPROFEN 800 MG PO TABS: 800.0000 mg | ORAL_TABLET | Freq: Three times a day (TID) | ORAL | 0 refills | Status: DC | PRN

## 2016-09-17 MED ORDER — OXYCODONE-ACETAMINOPHEN 5-325 MG PO TABS
1.0000 | ORAL_TABLET | Freq: Once | ORAL | Status: AC
Start: 2016-09-17 — End: 2016-09-17
  Administered 2016-09-17: 1 via ORAL
  Filled 2016-09-17: qty 1

## 2016-09-17 MED ORDER — OXYCODONE-ACETAMINOPHEN 5-325 MG PO TABS: 1.0000 | ORAL_TABLET | ORAL | 0 refills | Status: DC | PRN

## 2016-09-17 NOTE — ED Provider Notes (Signed)
[facility] Emergency Department Provider Note   ____________________________________________   First MD Initiated Contact with Patient 09/17/16 0033     (approximate)  I have reviewed the triage vital signs and the nursing notes.   HISTORY  Chief Complaint Abdominal Pain    HPI KEYSHIA Conway is a 51 y.o. female who presents to the ED from home with a chief complaint of pelvic pain and vaginal bleeding. Patient reports a history of uterine fibroids as well as ovarian cysts who has not had a period in 3 years. Began to experience light to moderate vaginal bleeding for the past 3 weeks, and presents this evening secondary to pelvic pain. Last sexual intercourse over one week ago.Denies use of anticoagulants. Denies associated fever, chills, chest pain, shortness of breath, nausea, vomiting, dysuria, diarrhea. Denies recent travel or trauma. Nothing makes her symptoms better or worse.   Past Medical History:  Diagnosis Date  . Bipolar 1 disorder, depressed, severe (HCC)   . Degenerative disc disease, lumbar   . Hypertension   . Mitral valve disorder     Patient Active Problem List   Diagnosis Date Noted  . Fall 07/30/2016  . Acute midline low back pain without sciatica 07/30/2016    Past Surgical History:  Procedure Laterality Date  . FRACTURE SURGERY    . TUBAL LIGATION      Prior to Admission medications   Medication Sig Start Date End Date Taking? Authorizing Provider  albuterol (PROVENTIL HFA;VENTOLIN HFA) 108 (90 BASE) MCG/ACT inhaler Inhale 2 puffs into the lungs every 4 (four) hours as needed for wheezing or shortness of breath. 07/15/15   Delorise Royals Cuthriell, PA-C  gabapentin (NEURONTIN) 100 MG capsule Take 1 capsule (100 mg total) by mouth 3 (three) times daily. 02/13/16 02/12/17  Tommi Rumps, PA-C  meloxicam (MOBIC) 7.5 MG tablet Take 7.5 mg by mouth daily.    Historical Provider, MD  methocarbamol (ROBAXIN) 500 MG tablet Take one  or 2 tablets every 6 hours as needed for muscle spasms. 07/05/16   Tommi Rumps, PA-C  traMADol (ULTRAM) 50 MG tablet Take 1 tablet (50 mg total) by mouth every 6 (six) hours as needed. 07/05/16 07/05/17  Tommi Rumps, PA-C    Allergies Doxycycline; Propoxyphene; Sulfa antibiotics; Tramadol; and Penicillins  No family history on file.  Social History Social History  Substance Use Topics  . Smoking status: Current Every Day Smoker    Packs/day: 0.50    Types: Cigarettes  . Smokeless tobacco: Never Used  . Alcohol use No    Review of Systems  Constitutional: No fever/chills. Eyes: No visual changes. ENT: No sore throat. Cardiovascular: Denies chest pain. Respiratory: Denies shortness of breath. Gastrointestinal: Positive for pelvic pain. No abdominal pain.  No nausea, no vomiting.  No diarrhea.  No constipation. Genitourinary: Positive for vaginal bleeding. Negative for dysuria. Musculoskeletal: Negative for back pain. Skin: Negative for rash. Neurological: Negative for headaches, focal weakness or numbness.  10-point ROS otherwise negative.  ____________________________________________   PHYSICAL EXAM:  VITAL SIGNS: ED Triage Vitals [09/16/16 2129]  Enc Vitals Group     BP 139/71     Pulse Rate 81     Resp 18     Temp 98.9 F (37.2 C)     Temp Source Oral     SpO2 100 %     Weight 211 lb (95.7 kg)     Height 5\' 4"  (1.626 m)     Head Circumference  Peak Flow      Pain Score 7     Pain Loc      Pain Edu?      Excl. in GC?     Constitutional: Alert and oriented. Well appearing and in no acute distress. Eyes: Conjunctivae are normal. PERRL. EOMI. Head: Atraumatic. Nose: No congestion/rhinnorhea. Mouth/Throat: Mucous membranes are moist.  Oropharynx non-erythematous. Neck: No stridor.   Cardiovascular: Normal rate, regular rhythm. Grossly normal heart sounds.  Good peripheral circulation. Respiratory: Normal respiratory effort.  No retractions.  Lungs CTAB. Gastrointestinal: Soft and mildly tender to palpation pelvis without rebound or guarding. No distention. No abdominal bruits. No CVA tenderness. Musculoskeletal: No lower extremity tenderness nor edema.  No joint effusions. Neurologic:  Normal speech and language. No gross focal neurologic deficits are appreciated. No gait instability. Skin:  Skin is warm, dry and intact. No rash noted. Psychiatric: Mood and affect are normal. Speech and behavior are normal.  ____________________________________________   LABS (all labs ordered are listed, but only abnormal results are displayed)  Labs Reviewed  WET PREP, GENITAL - Abnormal; Notable for the following:       Result Value   Clue Cells Wet Prep HPF POC PRESENT (*)    WBC, Wet Prep HPF POC MODERATE (*)    All other components within normal limits  CBC - Abnormal; Notable for the following:    WBC 11.5 (*)    RDW 15.1 (*)    All other components within normal limits  BASIC METABOLIC PANEL - Abnormal; Notable for the following:    Glucose, Bld 123 (*)    Calcium 8.4 (*)    All other components within normal limits  URINALYSIS, COMPLETE (UACMP) WITH MICROSCOPIC - Abnormal; Notable for the following:    Color, Urine YELLOW (*)    APPearance HAZY (*)    Hgb urine dipstick LARGE (*)    Leukocytes, UA TRACE (*)    Bacteria, UA RARE (*)    Squamous Epithelial / LPF 0-5 (*)    All other components within normal limits  CHLAMYDIA/NGC RT PCR ([facility] ONLY)   ____________________________________________  EKG  None ____________________________________________  RADIOLOGY  Ultrasound interpreted per Dr. Manus Gunning: Endometrial thickness of 9 mm, abnormal in a postmenopausal patient.  In the setting of post-menopausal bleeding, endometrial sampling is  indicated to exclude carcinoma. If results are benign,  sonohysterogram should be considered for focal lesion work-up. (Ref:  Radiological Reasoning: Algorithmic Workup of  Abnormal Vaginal  Bleeding with Endovaginal Sonography and Sonohysterography. AJR  2008; [id number]:[id number])   ____________________________________________   PROCEDURES  Procedure(s) performed:   Pelvic exam: External exam WNL without rashes, lesions or vesicles. Speculum exam with minimal vaginal bleeding. Cervical os closed. Bimanual exam WNL.  Procedures  Critical Care performed: No  ____________________________________________   INITIAL IMPRESSION / ASSESSMENT AND PLAN / ED COURSE  Pertinent labs & imaging results that were available during my care of the patient were reviewed by me and considered in my medical decision making (see chart for details).  51 year old female who has not had a period in the past 3 years presenting with dysfunctional uterine bleeding; history of uterine fibroids as well as ovarian cysts. Initial lab work and urinalysis are reassuring. Will administer analgesia and proceed with pelvic ultrasound.  Clinical Course as of Sep 17 236  Fri Sep 17, 2016  8119 Updated patient of ultrasound imaging results. Will discharge home with prescriptions for Flagyl and analgesics. Have encouraged her to follow-up with GYN for probable  endometrial biopsy. Strict return precautions given. Patient verbalizes understanding and agrees with plan of care.  [JS]    Clinical Course User Index [JS] Irean HongJade J Kyon Bentler, MD     ____________________________________________   FINAL CLINICAL IMPRESSION(S) / ED DIAGNOSES  Final diagnoses:  Pain  Pelvic pain in female  DUB (dysfunctional uterine bleeding)  Bacterial vaginosis      NEW MEDICATIONS STARTED DURING THIS VISIT:  New Prescriptions   No medications on file     Note:  This document was prepared using Dragon voice recognition software and may include unintentional dictation errors.    Irean HongJade J Bethzaida Boord, MD 09/17/16 [phone]

## 2016-09-17 NOTE — ED Notes (Signed)
Patient called ride prior to getting medication. Sat patient in lobby to wait for ride. 1st nurse notified about patient waiting for a ride.

## 2016-09-17 NOTE — Discharge Instructions (Signed)
1. Take antibiotic as prescribed (Flagyl 500 mg twice daily 7 days). 2. Take pain medicines as needed (Motrin/Percocet). 3. Please call the number provided to schedule an appointment with the gynecologist. You may need an endometrial biopsy to further study the cause of your bleeding. 4. Return to the ER for worsening symptoms, soaking more than 1 pad per hour, vomiting, fainting or other concerns.

## 2016-10-16 IMAGING — CR DG CHEST 2V
1 series · 2 of 2 positions shown · non-contrast
Comparison: None.

CLINICAL DATA: Productive cough for 3 days.  Smoker.

EXAM:
CHEST  2 VIEW

[Series 1: dxr chest pa (or ap) and lateral · 0.14mm/px · 2 of 2 slices shown]
[im 1/2]
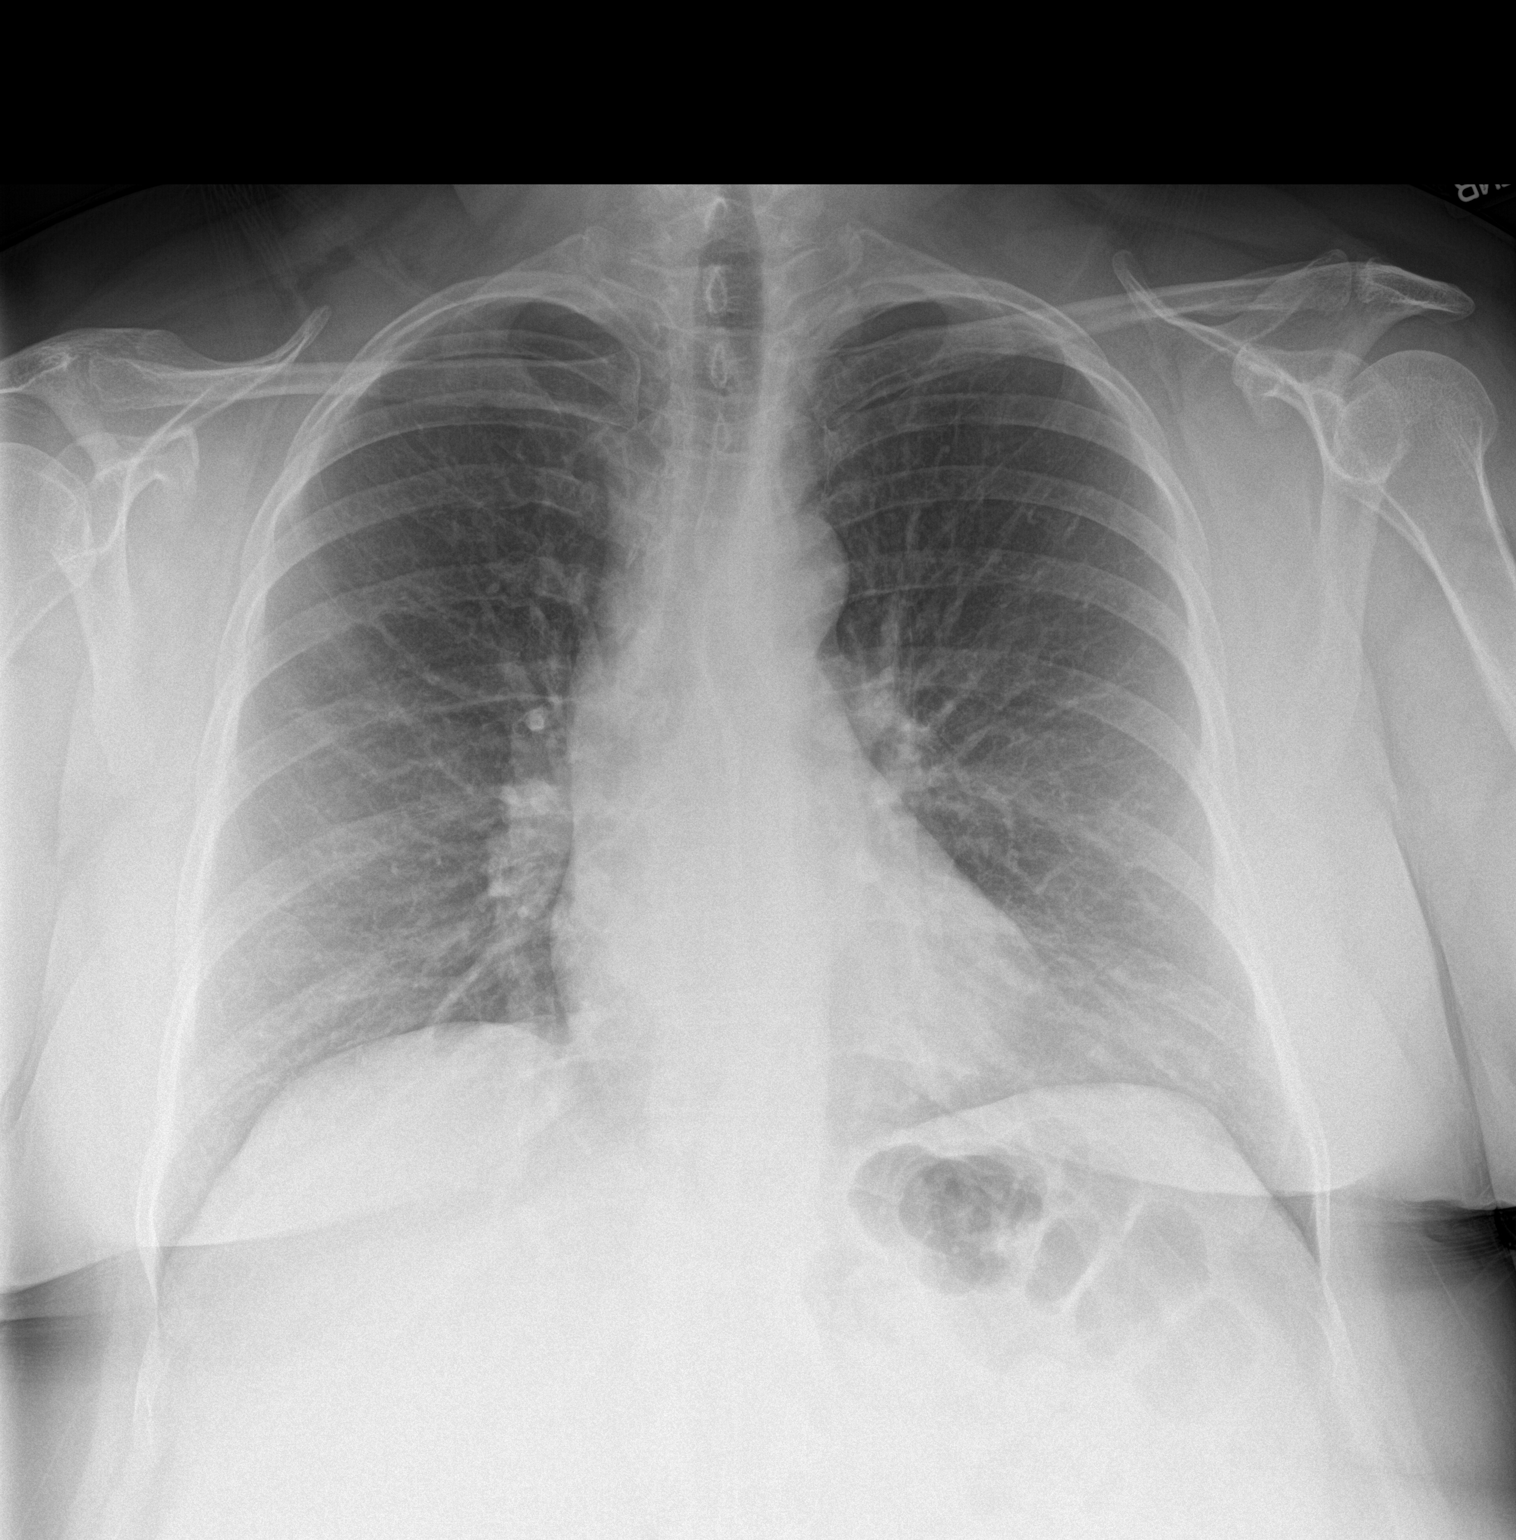
[im 2/2]
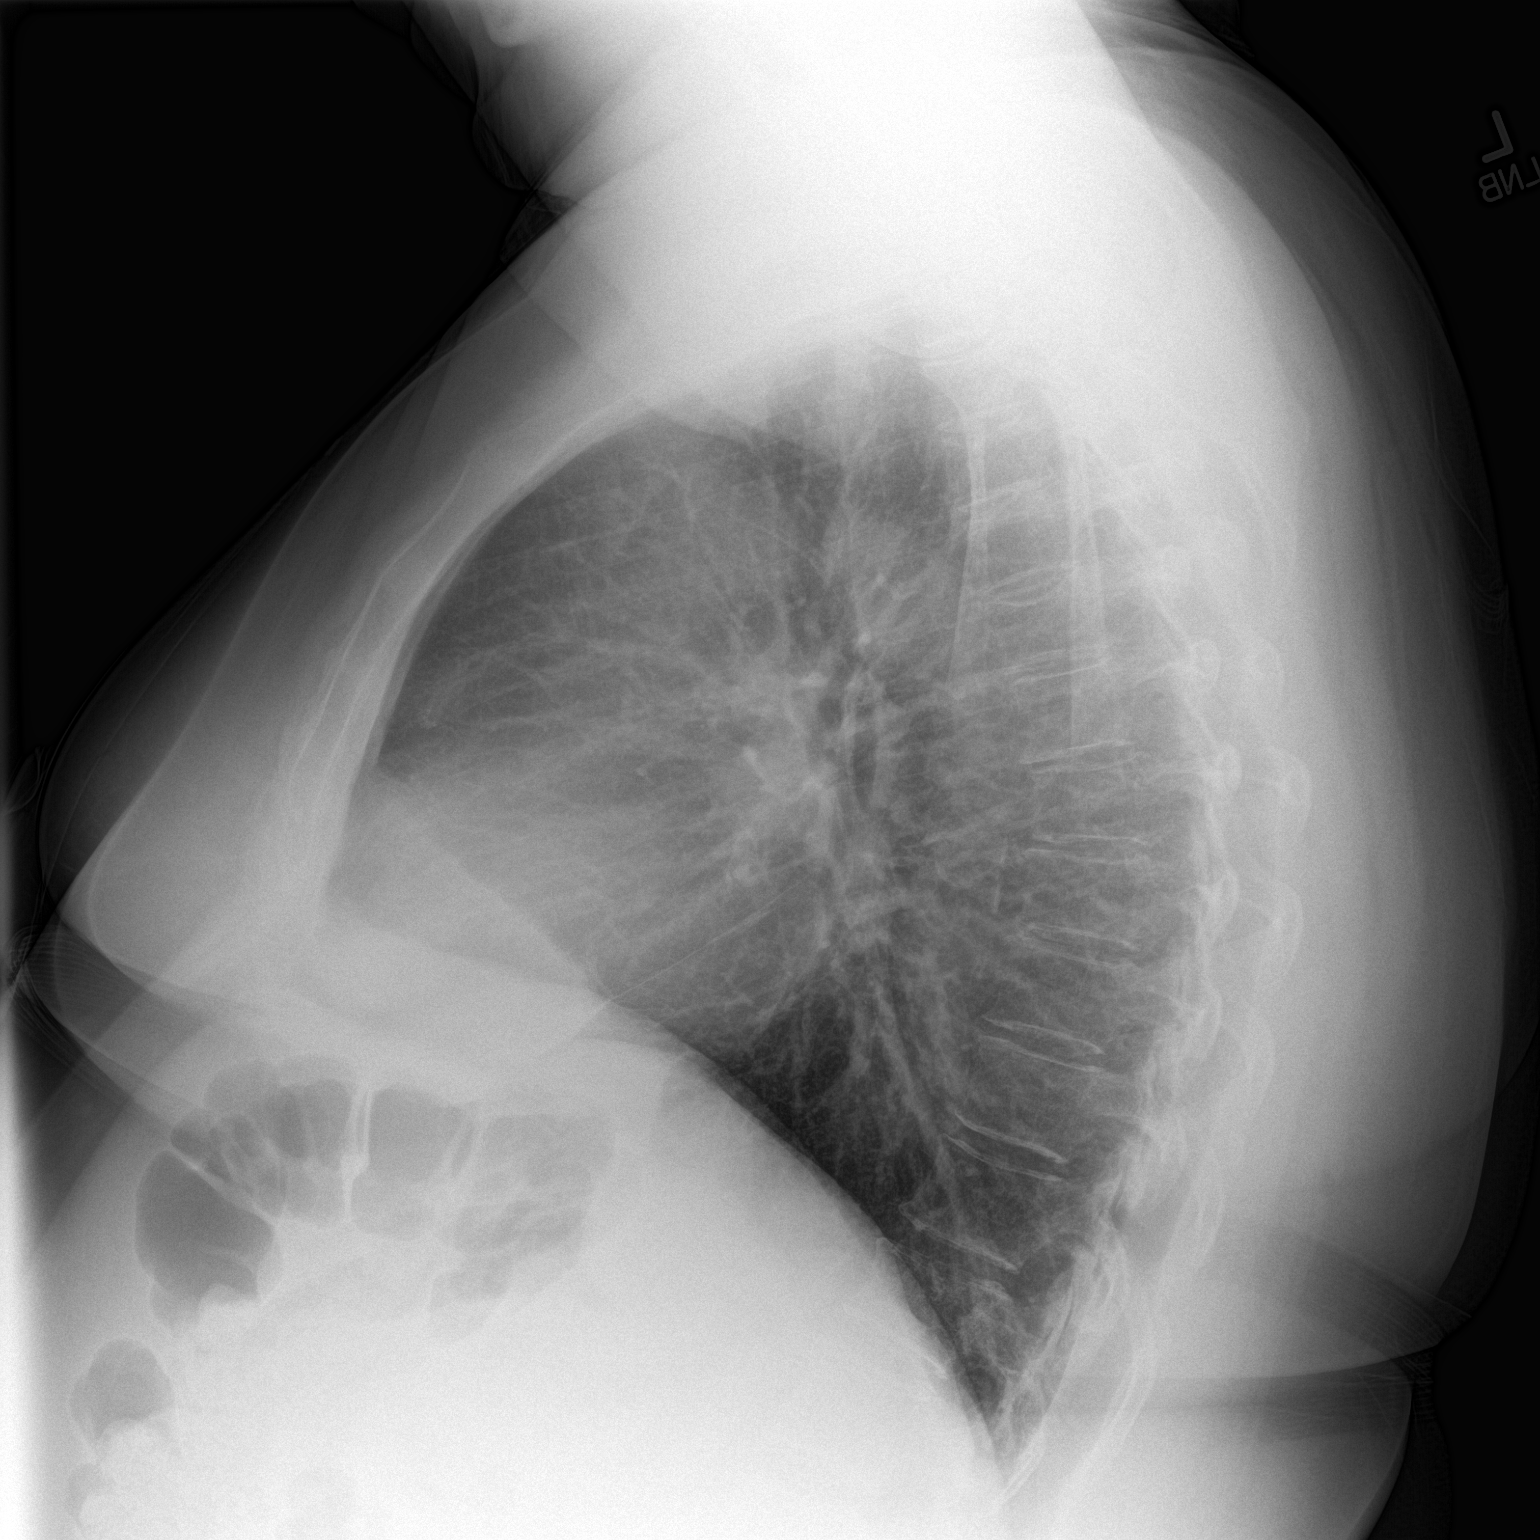

[2 of 2 positions shown; findings below may reference images not displayed]

FINDINGS: Normal heart, mediastinum and hila. Clear lungs. No pleural effusion
or pneumothorax.

Bony thorax is unremarkable.
IMPRESSION: No active cardiopulmonary disease.

## 2016-10-17 ENCOUNTER — Encounter: Payer: Self-pay | Admitting: Emergency Medicine

## 2016-10-17 ENCOUNTER — Emergency Department
Admission: EM | Admit: 2016-10-17 | Discharge: 2016-10-17 | Disposition: A | Discharge status: Home / Self Care | Payer: Self-pay | Attending: Emergency Medicine | Admitting: Emergency Medicine

## 2016-10-17 DIAGNOSIS — R1030 Lower abdominal pain, unspecified: Secondary | ICD-10-CM

## 2016-10-17 DIAGNOSIS — F17210 Nicotine dependence, cigarettes, uncomplicated: Secondary | ICD-10-CM | POA: Insufficient documentation

## 2016-10-17 DIAGNOSIS — Z791 Long term (current) use of non-steroidal anti-inflammatories (NSAID): Secondary | ICD-10-CM | POA: Insufficient documentation

## 2016-10-17 DIAGNOSIS — N390 Urinary tract infection, site not specified: Secondary | ICD-10-CM | POA: Insufficient documentation

## 2016-10-17 DIAGNOSIS — I10 Essential (primary) hypertension: Secondary | ICD-10-CM | POA: Insufficient documentation

## 2016-10-17 LAB — URINALYSIS, COMPLETE (UACMP) WITH MICROSCOPIC
Bilirubin Urine: NEGATIVE
Glucose, UA: NEGATIVE mg/dL
Hgb urine dipstick: NEGATIVE
Ketones, ur: NEGATIVE mg/dL
Nitrite: POSITIVE — AB
Protein, ur: NEGATIVE mg/dL
Specific Gravity, Urine: 1.024 (ref 1.005–1.030)
pH: 5 (ref 5.0–8.0)

## 2016-10-17 LAB — COMPREHENSIVE METABOLIC PANEL WITH GFR
ALT: 70 U/L — ABNORMAL HIGH (ref 14–54)
AST: 68 U/L — ABNORMAL HIGH (ref 15–41)
Albumin: 4.1 g/dL (ref 3.5–5.0)
Alkaline Phosphatase: 99 U/L (ref 38–126)
Anion gap: 8 (ref 5–15)
BUN: 10 mg/dL (ref 6–20)
CO2: 25 mmol/L (ref 22–32)
Calcium: 8.9 mg/dL (ref 8.9–10.3)
Chloride: 106 mmol/L (ref 101–111)
Creatinine, Ser: 0.69 mg/dL (ref 0.44–1.00)
GFR calc Af Amer: >60 mL/min
GFR calc non Af Amer: >60 mL/min
Glucose, Bld: 132 mg/dL — ABNORMAL HIGH (ref 65–99)
Potassium: 4.1 mmol/L (ref 3.5–5.1)
Sodium: 139 mmol/L (ref 135–145)
Total Bilirubin: 0.6 mg/dL (ref 0.3–1.2)
Total Protein: 7.2 g/dL (ref 6.5–8.1)

## 2016-10-17 LAB — CBC
HCT: 41.1 % (ref 35.0–47.0)
Hemoglobin: 14.3 g/dL (ref 12.0–16.0)
MCH: 29.6 pg (ref 26.0–34.0)
MCHC: 34.8 g/dL (ref 32.0–36.0)
MCV: 85.3 fL (ref 80.0–100.0)
PLATELETS: 263 10*3/uL (ref 150–440)
RBC: 4.82 MIL/uL (ref 3.80–5.20)
RDW: 15.3 % — ABNORMAL HIGH (ref 11.5–14.5)
WBC: 9.8 10*3/uL (ref 3.6–11.0)

## 2016-10-17 LAB — LIPASE, BLOOD: Lipase: 30 U/L (ref 11–51)

## 2016-10-17 MED ORDER — NITROFURANTOIN MONOHYD MACRO 100 MG PO CAPS
100.0000 mg | ORAL_CAPSULE | Freq: Once | ORAL | Status: AC
  Administered 2016-10-17: 100 mg via ORAL
  Filled 2016-10-17: qty 1

## 2016-10-17 MED ORDER — KETOROLAC TROMETHAMINE 60 MG/2ML IM SOLN
60.0000 mg | Freq: Once | INTRAMUSCULAR | Status: AC
  Administered 2016-10-17: 60 mg via INTRAMUSCULAR
  Filled 2016-10-17: qty 2

## 2016-10-17 MED ORDER — NITROFURANTOIN MONOHYD MACRO 100 MG PO CAPS: 100.0000 mg | ORAL_CAPSULE | Freq: Two times a day (BID) | ORAL | 0 refills | Status: AC

## 2016-10-17 MED ORDER — DICYCLOMINE HCL 10 MG/ML IM SOLN
20.0000 mg | Freq: Once | INTRAMUSCULAR | Status: AC
  Administered 2016-10-17: 20 mg via INTRAMUSCULAR
  Filled 2016-10-17: qty 2

## 2016-10-17 MED ORDER — KETOROLAC TROMETHAMINE 10 MG PO TABS: 10.0000 mg | ORAL_TABLET | Freq: Three times a day (TID) | ORAL | 0 refills | Status: DC | PRN

## 2016-10-17 NOTE — Discharge Instructions (Signed)
Please seek medical attention for any high fevers, chest pain, shortness of breath, change in behavior, persistent vomiting, bloody stool or any other new or concerning symptoms.  

## 2016-10-17 NOTE — ED Provider Notes (Signed)
[facility] Emergency Department Provider Note   ____________________________________________   I have reviewed the triage vital signs and the nursing notes.   HISTORY  Chief Complaint Abdominal Pain; Diarrhea; and Emesis   History limited by: Not Limited   HPI Robin Conway is a 51 y.o. female who presents to the emergency department today because of concerns for continued lower abdominal pain and some diarrhea. The patient has had the lower abdominal pain for a little over 1 month. She was seen in the emergency department last month for and had an ultrasound performed which showed thickening of the endometrial stripe. The patient has not had an opportunity to follow-up with OB/GYN since that time. She states she has continued to have pain in her uterus area. In addition the patient states she is now having some diarrhea. It is watery.    Past Medical History:  Diagnosis Date  . Bipolar 1 disorder, depressed, severe (HCC)   . Degenerative disc disease, lumbar   . Hypertension   . Mitral valve disorder     Patient Active Problem List   Diagnosis Date Noted  . Fall 07/30/2016  . Acute midline low back pain without sciatica 07/30/2016    Past Surgical History:  Procedure Laterality Date  . FRACTURE SURGERY    . TUBAL LIGATION      Prior to Admission medications   Medication Sig Start Date End Date Taking? Authorizing Provider  albuterol (PROVENTIL HFA;VENTOLIN HFA) 108 (90 BASE) MCG/ACT inhaler Inhale 2 puffs into the lungs every 4 (four) hours as needed for wheezing or shortness of breath. 07/15/15   Delorise RoyalsJonathan D Cuthriell, PA-C  gabapentin (NEURONTIN) 100 MG capsule Take 1 capsule (100 mg total) by mouth 3 (three) times daily. 02/13/16 02/12/17  Tommi Rumpshonda L Summers, PA-C  ibuprofen (ADVIL,MOTRIN) 800 MG tablet Take 1 tablet (800 mg total) by mouth every 8 (eight) hours as needed for moderate pain. 09/17/16   Irean HongJade J Sung, MD  meloxicam (MOBIC) 7.5 MG tablet  Take 7.5 mg by mouth daily.    Historical Provider, MD  methocarbamol (ROBAXIN) 500 MG tablet Take one or 2 tablets every 6 hours as needed for muscle spasms. 07/05/16   Tommi Rumpshonda L Summers, PA-C  metroNIDAZOLE (FLAGYL) 500 MG tablet Take 1 tablet (500 mg total) by mouth 2 (two) times daily. 09/17/16   Irean HongJade J Sung, MD  oxyCODONE-acetaminophen (ROXICET) 5-325 MG tablet Take 1 tablet by mouth every 4 (four) hours as needed for severe pain. 09/17/16   Irean HongJade J Sung, MD  traMADol (ULTRAM) 50 MG tablet Take 1 tablet (50 mg total) by mouth every 6 (six) hours as needed. 07/05/16 07/05/17  Tommi Rumpshonda L Summers, PA-C    Allergies Doxycycline; Propoxyphene; Sulfa antibiotics; Tramadol; and Penicillins  No family history on file.  Social History Social History  Substance Use Topics  . Smoking status: Current Every Day Smoker    Packs/day: 0.50    Types: Cigarettes  . Smokeless tobacco: Never Used  . Alcohol use No    Review of Systems  Constitutional: Negative for fever. Cardiovascular: Negative for chest pain. Respiratory: Negative for shortness of breath. Gastrointestinal: Positive for abdominal pain. Neurological: Negative for headaches, focal weakness or numbness.  10-point ROS otherwise negative.  ____________________________________________   PHYSICAL EXAM:  VITAL SIGNS: ED Triage Vitals  Enc Vitals Group     BP 10/17/16 1740 133/82     Pulse Rate 10/17/16 1740 80     Resp 10/17/16 1740 18  Temp 10/17/16 1740 97.7 F (36.5 C)     Temp Source 10/17/16 1740 Oral     SpO2 10/17/16 1740 95 %     Weight 10/17/16 1736 211 lb (95.7 kg)     Height 10/17/16 1736 5\' 4"  (1.626 m)     Head Circumference --      Peak Flow --      Pain Score 10/17/16 1736 8   Constitutional: Alert and oriented. Well appearing and in no distress. Eyes: Conjunctivae are normal. Normal extraocular movements. ENT   Head: Normocephalic and atraumatic.   Nose: No congestion/rhinnorhea.    Mouth/Throat: Mucous membranes are moist.   Neck: No stridor. Hematological/Lymphatic/Immunilogical: No cervical lymphadenopathy. Cardiovascular: Normal rate, regular rhythm.  No murmurs, rubs, or gallops.  Respiratory: Normal respiratory effort without tachypnea nor retractions. Breath sounds are clear and equal bilaterally. No wheezes/rales/rhonchi. Gastrointestinal: Soft and non tender. No rebound. No guarding.  Genitourinary: Deferred Musculoskeletal: Normal range of motion in all extremities. No lower extremity edema. Neurologic:  Normal speech and language. No gross focal neurologic deficits are appreciated.  Skin:  Skin is warm, dry and intact. No rash noted. Psychiatric: Mood and affect are normal. Speech and behavior are normal. Patient exhibits appropriate insight and judgment.  ____________________________________________    LABS (pertinent positives/negatives)  Labs Reviewed  COMPREHENSIVE METABOLIC PANEL - Abnormal; Notable for the following:       Result Value   Glucose, Bld 132 (*)    AST 68 (*)    ALT 70 (*)    All other components within normal limits  CBC - Abnormal; Notable for the following:    RDW 15.3 (*)    All other components within normal limits  URINALYSIS, COMPLETE (UACMP) WITH MICROSCOPIC - Abnormal; Notable for the following:    Color, Urine YELLOW (*)    APPearance HAZY (*)    Nitrite POSITIVE (*)    Leukocytes, UA SMALL (*)    Bacteria, UA FEW (*)    Squamous Epithelial / LPF 6-30 (*)    All other components within normal limits  URINE CULTURE  LIPASE, BLOOD     ____________________________________________   EKG  None  ____________________________________________    RADIOLOGY  None  ____________________________________________   PROCEDURES  Procedures  ____________________________________________   INITIAL IMPRESSION / ASSESSMENT AND PLAN / ED COURSE  Pertinent labs & imaging results that were available during my  care of the patient were reviewed by me and considered in my medical decision making (see chart for details).  Patient presented to the emergency department today because continued lower abdominal pain. The patient was found to have a urinary tract infection. The patient has multiple allergies to antibiotics and will try Macrobid. Will send the urine for culture.  ____________________________________________   FINAL CLINICAL IMPRESSION(S) / ED DIAGNOSES  Final diagnoses:  Lower abdominal pain  Lower urinary tract infectious disease     Note: This dictation was prepared with Dragon dictation. Any transcriptional errors that result from this process are unintentional     Phineas Semen, MD 10/17/16 6962

## 2016-10-17 NOTE — ED Notes (Signed)
Pt in with abd pain, n/d. Pt in hallway from lobby. Labs and urine obtained in triage.

## 2016-10-17 NOTE — ED Triage Notes (Signed)
Pt c/o lower abdominal pain, emesis and diarrhea. Pt states was seen here a few weeks ago and was told that she had an abnormal pap smear and an abnormal US. Pt states she has not had an appt with GYN since then. Pt denies blood in vomit, states some red streaking of blood in her diarrhea, but states of hemorrhoids.

## 2016-10-20 LAB — URINE CULTURE: Culture: >=100000 — AB

## 2016-10-21 ENCOUNTER — Emergency Department
Admission: EM | Admit: 2016-10-21 | Discharge: 2016-10-21 | Disposition: A | Discharge status: Home / Self Care | Payer: Self-pay | Attending: Emergency Medicine | Admitting: Emergency Medicine

## 2016-10-21 DIAGNOSIS — I10 Essential (primary) hypertension: Secondary | ICD-10-CM | POA: Insufficient documentation

## 2016-10-21 DIAGNOSIS — R102 Pelvic and perineal pain: Secondary | ICD-10-CM

## 2016-10-21 DIAGNOSIS — Z791 Long term (current) use of non-steroidal anti-inflammatories (NSAID): Secondary | ICD-10-CM | POA: Insufficient documentation

## 2016-10-21 DIAGNOSIS — Z79899 Other long term (current) drug therapy: Secondary | ICD-10-CM | POA: Insufficient documentation

## 2016-10-21 DIAGNOSIS — F17210 Nicotine dependence, cigarettes, uncomplicated: Secondary | ICD-10-CM | POA: Insufficient documentation

## 2016-10-21 DIAGNOSIS — N390 Urinary tract infection, site not specified: Secondary | ICD-10-CM | POA: Insufficient documentation

## 2016-10-21 LAB — URINALYSIS, COMPLETE (UACMP) WITH MICROSCOPIC
Bilirubin Urine: NEGATIVE
GLUCOSE, UA: NEGATIVE mg/dL
Hgb urine dipstick: NEGATIVE
Ketones, ur: NEGATIVE mg/dL
Nitrite: NEGATIVE
PH: 7 (ref 5.0–8.0)
PROTEIN: NEGATIVE mg/dL
Specific Gravity, Urine: 1.01 (ref 1.005–1.030)

## 2016-10-21 MED ORDER — OXYCODONE-ACETAMINOPHEN 5-325 MG PO TABS
1.0000 | ORAL_TABLET | Freq: Once | ORAL | Status: AC
  Administered 2016-10-21: 1 via ORAL
  Filled 2016-10-21: qty 1

## 2016-10-21 MED ORDER — OXYCODONE-ACETAMINOPHEN 5-325 MG PO TABS: 1.0000 | ORAL_TABLET | ORAL | 0 refills | Status: DC | PRN

## 2016-10-21 MED ORDER — FOSFOMYCIN TROMETHAMINE 3 G PO PACK
3.0000 g | PACK | Freq: Once | ORAL | Status: AC
Start: 2016-10-21 — End: 2016-10-21
  Administered 2016-10-21: 3 g via ORAL
  Filled 2016-10-21: qty 3

## 2016-10-21 NOTE — ED Triage Notes (Signed)
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

## 2016-10-21 NOTE — ED Provider Notes (Addendum)
[facility] Emergency Department Provider Note  ____________________________________________   First MD Initiated Contact with Patient 10/21/16 [phone]     (approximate)  I have reviewed the triage vital signs and the nursing notes.   HISTORY  Chief Complaint Pelvic Pain   HPI SPARKLE Robin Conway is a 51 y.o. female with a history of bipolar disease and hypertension with recent diagnosis of a possible endometrial cancer who is presenting to the emergency department with pelvic pain over the past month. She says the pain is intermittent and associated bleeding. She says that she is not having bleeding now and has not had bleeding for about a week at this time. Is not reporting any vaginal discharge. Will also recently diagnosed with urinary tract infection and discharged home with Macrobid because of multiple antibiotic allergies. However, the urine grew an Escherichia coli that was resistant to Macrobid. Patient is not having any dysuria at this time. Says the pain is 9 out of 10. Denies any nausea or vomiting.   Past Medical History:  Diagnosis Date  . Bipolar 1 disorder, depressed, severe (HCC)   . Degenerative disc disease, lumbar   . Hypertension   . Mitral valve disorder     Patient Active Problem List   Diagnosis Date Noted  . Fall 07/30/2016  . Acute midline low back pain without sciatica 07/30/2016    Past Surgical History:  Procedure Laterality Date  . FRACTURE SURGERY    . TUBAL LIGATION      Prior to Admission medications   Medication Sig Start Date End Date Taking? Authorizing Provider  FLUoxetine (PROZAC) 20 MG tablet Take 60 mg by mouth daily.   Yes Historical Provider, MD  gabapentin (NEURONTIN) 100 MG capsule Take 1 capsule (100 mg total) by mouth 3 (three) times daily. Patient taking differently: Take 600 mg by mouth 3 (three) times daily.  02/13/16 02/12/17 Yes Tommi Rumps, PA-C  ibuprofen (ADVIL,MOTRIN) 800 MG tablet Take 1 tablet  (800 mg total) by mouth every 8 (eight) hours as needed for moderate pain. 09/17/16  Yes Irean Hong, MD  lamoTRIgine (LAMICTAL) 150 MG tablet Take 150 mg by mouth 2 (two) times daily.   Yes Historical Provider, MD  meloxicam (MOBIC) 15 MG tablet Take 15 mg by mouth daily.    Yes Historical Provider, MD  albuterol (PROVENTIL HFA;VENTOLIN HFA) 108 (90 BASE) MCG/ACT inhaler Inhale 2 puffs into the lungs every 4 (four) hours as needed for wheezing or shortness of breath. Patient not taking: Reported on 10/21/2016 07/15/15   Delorise Royals Cuthriell, PA-C  ketorolac (TORADOL) 10 MG tablet Take 1 tablet (10 mg total) by mouth every 8 (eight) hours as needed for severe pain. Patient not taking: Reported on 10/21/2016 10/17/16   Phineas Semen, MD  methocarbamol (ROBAXIN) 500 MG tablet Take one or 2 tablets every 6 hours as needed for muscle spasms. Patient not taking: Reported on 10/21/2016 07/05/16   Tommi Rumps, PA-C  metroNIDAZOLE (FLAGYL) 500 MG tablet Take 1 tablet (500 mg total) by mouth 2 (two) times daily. Patient not taking: Reported on 10/21/2016 09/17/16   Irean Hong, MD  nitrofurantoin, macrocrystal-monohydrate, (MACROBID) 100 MG capsule Take 1 capsule (100 mg total) by mouth 2 (two) times daily. Patient not taking: Reported on 10/21/2016 10/17/16 10/24/16  Phineas Semen, MD  oxyCODONE-acetaminophen (ROXICET) 5-325 MG tablet Take 1 tablet by mouth every 4 (four) hours as needed for severe pain. Patient not taking: Reported on 10/21/2016 09/17/16  Irean Hong, MD  traMADol (ULTRAM) 50 MG tablet Take 1 tablet (50 mg total) by mouth every 6 (six) hours as needed. Patient not taking: Reported on 10/21/2016 07/05/16 07/05/17  Tommi Rumps, PA-C    Allergies Penicillins; Doxycycline; Propoxyphene; Sulfa antibiotics; and Tramadol  No family history on file.  Social History Social History  Substance Use Topics  . Smoking status: Current Every Day Smoker    Packs/day: 0.50    Types: Cigarettes  .  Smokeless tobacco: Never Used  . Alcohol use No    Review of Systems Constitutional: No fever/chills Eyes: No visual changes. ENT: No sore throat. Cardiovascular: Denies chest pain. Respiratory: Denies shortness of breath. Gastrointestinal:  No nausea, no vomiting.  No diarrhea.  No constipation. Genitourinary: Negative for dysuria. Musculoskeletal: Negative for back pain. Skin: Negative for rash. Neurological: Negative for headaches, focal weakness or numbness.  10-point ROS otherwise negative.  ____________________________________________   PHYSICAL EXAM:  VITAL SIGNS: ED Triage Vitals  Enc Vitals Group     BP 10/21/16 0914 (!) 156/83     Pulse Rate 10/21/16 0914 79     Resp 10/21/16 0914 17     Temp 10/21/16 0914 97.7 F (36.5 C)     Temp Source 10/21/16 0914 Oral     SpO2 10/21/16 0914 97 %     Weight 10/21/16 0915 211 lb (95.7 kg)     Height 10/21/16 0915 5\' 4"  (1.626 m)     Head Circumference --      Peak Flow --      Pain Score 10/21/16 0915 7     Pain Loc --      Pain Edu? --      Excl. in GC? --     Constitutional: Alert and oriented. Well appearing and in no acute distress. Eyes: Conjunctivae are normal. PERRL. EOMI. Head: Atraumatic. Nose: No congestion/rhinnorhea. Mouth/Throat: Mucous membranes are moist.  Oropharynx non-erythematous. Neck: No stridor.   Cardiovascular: Normal rate, regular rhythm. Grossly normal heart sounds.  Good peripheral circulation. Respiratory: Normal respiratory effort.  No retractions. Lungs CTAB. Gastrointestinal: Soft With moderate tenderness to palpation across lower abdomen. No distention. No CVA tenderness. Musculoskeletal: No lower extremity tenderness nor edema.  No joint effusions. Neurologic:  Normal speech and language. No gross focal neurologic deficits are appreciated. Skin:  Skin is warm, dry and intact. No rash noted. Psychiatric: Mood and affect are normal. Speech and behavior are  normal.  ____________________________________________   LABS (all labs ordered are listed, but only abnormal results are displayed)  Labs Reviewed  URINALYSIS, COMPLETE (UACMP) WITH MICROSCOPIC - Abnormal; Notable for the following:       Result Value   Color, Urine YELLOW (*)    APPearance CLEAR (*)    Leukocytes, UA LARGE (*)    Bacteria, UA RARE (*)    Squamous Epithelial / LPF 0-5 (*)    All other components within normal limits   ____________________________________________  EKG   ____________________________________________  RADIOLOGY   ____________________________________________   PROCEDURES  Procedure(s) performed:   Procedures  Critical Care performed:   ____________________________________________   INITIAL IMPRESSION / ASSESSMENT AND PLAN / ED COURSE  Pertinent labs & imaging results that were available during my care of the patient were reviewed by me and considered in my medical decision making (see chart for details).  We will give the patient a Percocet here in the emergency department. We will also recheck the urine to see if the patient's urine  appears persistently infected. Patient also says that she is having difficulty following up with OB/GYN. She says that [facility]. I will give [facility] the [facility] as well as the on-call practice here at the hospital. Patient will need further workup including likely a biopsy of the lesion. She has had 2 ultrasounds. She says she reports one here in the emergency department about a month ago and then also as an outpatient and her primary care doctor's office a week after that.    ----------------------------------------- 10:25 AM on 10/21/2016 -----------------------------------------  Patient is showing again signs of UTI. We will treat with fosfomycin. Patient says that still in pain despite having Percocet is requesting  another Percocet. Said the last time she had this medication and took several days before started to "calm down." The patient is saying that this is the exact pain that she has been having over the past month that she is associated with her endometrial growth. We discussed further follow-up and [facility]'ll [facility] or the on-call doctor here at the hospital. She is understanding of this plan and willing to comply. Will be discharged home.  ____________________________________________   FINAL CLINICAL IMPRESSION(S) / ED DIAGNOSES  Pelvic pain. UTI.    NEW MEDICATIONS STARTED DURING THIS VISIT:  New Prescriptions   No medications on file     Note:  This document was prepared using Dragon voice recognition software and may include unintentional dictation errors.    Myrna Blazeravid Matthew Schaevitz, MD 10/21/16 1026  Pt says she is no longer on clonazapam.      Myrna Blazeravid Matthew Schaevitz, MD 10/21/16 1037

## 2016-10-24 LAB — URINE CULTURE

## 2016-11-09 ENCOUNTER — Encounter: Payer: Self-pay | Admitting: Obstetrics and Gynecology

## 2016-11-09 ENCOUNTER — Ambulatory Visit (INDEPENDENT_AMBULATORY_CARE_PROVIDER_SITE_OTHER): Payer: Self-pay | Admitting: Obstetrics and Gynecology

## 2016-11-09 DIAGNOSIS — R102 Pelvic and perineal pain: Secondary | ICD-10-CM

## 2016-11-09 DIAGNOSIS — N950 Postmenopausal bleeding: Secondary | ICD-10-CM

## 2016-11-09 MED ORDER — OXYCODONE-ACETAMINOPHEN 5-325 MG PO TABS: 1.0000 | ORAL_TABLET | Freq: Four times a day (QID) | ORAL | 0 refills | Status: DC | PRN

## 2016-11-09 NOTE — Progress Notes (Signed)
Obstetrics & Gynecology Office Visit   Chief Complaint  Patient presents with  . Follow-up  . Pelvic Pain    History of Present Illness: Abdominal Pain: Patient complains of abdominal pain. The pain is described as aching, cramping, dull and sharp, and is 6/10 in intensity. Pain is located in the suprapubic without radiation. Onset was 6 months ago. Symptoms have been gradually worsening since. Aggravating factors: movement.  Alleviating factors: none. Associated symptoms: anorexia, diarrhea and early satiety, bloating, weight loss (12 pounds in several months, unintentional). The patient denies constipation, fever, hematochezia and melena.  Postmenopausal Bleeding: Patient complains of vaginal bleeding. She has been menopausal for 2 years. Currently on no HRT. Bleeding is described as heavier than a normal period, changing protection every several hours and has occurred 2 times. Other menopausal symptoms: none. Workup to date: pelvic ultrasound.  Last menses was 2 years ago.  She had an episode of bleeding a couple of months ago that was very heavy, then stopped abruptly.  She had another episode a few weeks ago that was similar.  She was seen at [facility] ER in 09/17/16.  She has been diagnosed with a UTI and was given fosfamycin, which she states she was unable to complete due to having several of the pills lost down the drain on accident. See the ultrasound results below. Essentially normal, but with an endometrial stripe of 9mm. She has a distant history of an abnormal pap smear, but does not remember her last one.  Denies STDs.  Has had urinary symptoms.  Only percocet seems to relieve.   Review of Systems: Review of Systems  Constitutional: Positive for malaise/fatigue and weight loss.  HENT: Negative.   Eyes: Negative.   Respiratory: Negative.   Cardiovascular: Negative.   Gastrointestinal: Positive for abdominal pain and diarrhea. Negative for blood in stool, melena, nausea and vomiting.    Genitourinary: Positive for dysuria and frequency. Negative for hematuria.  Musculoskeletal: Negative.   Skin: Negative.   Neurological: Negative.   Endo/Heme/Allergies: Negative.   Psychiatric/Behavioral: Negative.     Past Medical History:  Diagnosis Date  . Bipolar 1 disorder, depressed, severe (HCC)   . Degenerative disc disease, lumbar   . Hypertension   . Mitral valve disorder     Past Surgical History:  Procedure Laterality Date  . FRACTURE SURGERY    . TUBAL LIGATION      Gynecologic History: No LMP recorded. Patient is postmenopausal.  Obstetric History: [id number]  Family History:  Denies history of gynecologic cancer History reviewed. No pertinent family history.  Social History   Social History  . Marital status: Legally Separated    Spouse name: N/A  . Number of children: N/A  . Years of education: N/A   Occupational History  . Not on file.   Social History Main Topics  . Smoking status: Current Every Day Smoker    Packs/day: 0.50    Types: Cigarettes  . Smokeless tobacco: Never Used  . Alcohol use No  . Drug use: No  . Sexual activity: Yes    Birth control/ protection: Post-menopausal   Other Topics Concern  . Not on file   Social History Narrative  . No narrative on file    Allergies  Allergen Reactions  . Penicillins Anaphylaxis    Has patient had a PCN reaction causing immediate rash, facial/tongue/throat swelling, SOB or lightheadedness with hypotension: Yes Has patient had a PCN reaction causing severe rash involving mucus membranes or skin necrosis:  No Has patient had a PCN reaction that required hospitalization No Has patient had a PCN reaction occurring within the last 10 years: No If all of the above answers are "NO", then may proceed with Cephalosporin use.   Marland Kitchen Doxycycline Swelling  . Propoxyphene Other (See Comments)    darvocet  . Sulfa Antibiotics Swelling  . Tramadol Itching    Medications:   Medication Sig Start  Date End Date Taking? Authorizing Provider  albuterol (PROVENTIL HFA;VENTOLIN HFA) 108 (90 BASE) MCG/ACT inhaler Inhale 2 puffs into the lungs every 4 (four) hours as needed for wheezing or shortness of breath. 07/15/15  Yes Christiane Ha D Cuthriell, PA-C  FLUoxetine (PROZAC) 20 MG tablet Take 60 mg by mouth daily.   Yes Historical Provider, MD  gabapentin (NEURONTIN) 100 MG capsule Take 1 capsule (100 mg total) by mouth 3 (three) times daily. Patient taking differently: Take 600 mg by mouth 3 (three) times daily.  02/13/16 02/12/17 Yes Tommi Rumps, PA-C  ibuprofen (ADVIL,MOTRIN) 800 MG tablet Take 1 tablet (800 mg total) by mouth every 8 (eight) hours as needed for moderate pain. 09/17/16  Yes Irean Hong, MD  ketorolac (TORADOL) 10 MG tablet Take 1 tablet (10 mg total) by mouth every 8 (eight) hours as needed for severe pain. 10/17/16  Yes Phineas Semen, MD  lamoTRIgine (LAMICTAL) 150 MG tablet Take 150 mg by mouth 2 (two) times daily.   Yes Historical Provider, MD  meloxicam (MOBIC) 15 MG tablet Take 15 mg by mouth daily.    Yes Historical Provider, MD  methocarbamol (ROBAXIN) 500 MG tablet Take one or 2 tablets every 6 hours as needed for muscle spasms. 07/05/16  Yes Tommi Rumps, PA-C  oxyCODONE-acetaminophen (ROXICET) 5-325 MG tablet Take 1-2 tablets by mouth every 4 (four) hours as needed for severe pain. 10/21/16  Yes Myrna Blazer, MD  traMADol (ULTRAM) 50 MG tablet Take 1 tablet (50 mg total) by mouth every 6 (six) hours as needed. 07/05/16 07/05/17 Yes Tommi Rumps, PA-C  metroNIDAZOLE (FLAGYL) 500 MG tablet Take 1 tablet (500 mg total) by mouth 2 (two) times daily. Patient not taking: Reported on 10/21/2016 09/17/16   Irean Hong, MD    Physical Exam BP 124/72   Ht 5\' 4"  (1.626 m)   Wt 204 lb (92.5 kg)   BMI 35.02 kg/m   No LMP recorded. Patient is postmenopausal.  Physical Exam  Constitutional: She is oriented to person, place, and time. She appears well-developed and  well-nourished. No distress.  Genitourinary: Vagina normal. Pelvic exam was performed with patient supine. There is no rash, tenderness or lesion on the right labia. There is no rash, tenderness or lesion on the left labia. Vagina exhibits rugosity. Vagina exhibits no lesion. No erythema or bleeding in the vagina. No signs of injury around the vagina.  Right adnexum non-palpable.  Left adnexum non-palpable. Cervix does not exhibit motion tenderness, discharge, polyp or nabothian cyst.   Uterus is tender and anteverted. Uterus is not enlarged, exhibiting a mass or mobile.  Genitourinary Comments: Tender to palpation over bilateral adnexae. Rectovaginal is confirmatory.   HENT:  Head: Normocephalic and atraumatic.  Eyes: Conjunctivae and EOM are normal. No scleral icterus.  Neck: Normal range of motion. Neck supple. No tracheal deviation present. No thyromegaly present.  Cardiovascular: Normal rate and regular rhythm.   No murmur heard. Pulmonary/Chest: Effort normal and breath sounds normal. She has no wheezes. She has no rales.  Abdominal: Soft. She exhibits  no distension and no mass. There is tenderness (suprpubic area). There is guarding (suprapubic area). There is no rebound. Hernia confirmed negative in the right inguinal area and confirmed negative in the left inguinal area.  Musculoskeletal: Normal range of motion. She exhibits no edema.  Lymphadenopathy:       Right: No inguinal adenopathy present.       Left: No inguinal adenopathy present.  Neurological: She is alert and oriented to person, place, and time. No cranial nerve deficit.  Skin: Skin is warm and dry. No rash noted.  Psychiatric: She has a normal mood and affect. Her behavior is normal. Judgment normal.   Female chaperone present for pelvic and breast  portions of the physical exam  Pelvic ultrasound report (09/17/16): FINDINGS: Uterus  Measurements: 7.6 x 3.8 x 4.7 cm. No fibroids or other  mass visualized.  Endometrium  Thickness: 9 mm, best assessed transvaginally. No focal abnormality visualized.  Right ovary  Not visualized.  No adnexal mass.  Left ovary  Not visualized.  No adnexal mass.  Other findings  No abnormal free fluid.  IMPRESSION: Endometrial thickness of 9 mm, abnormal in a postmenopausal patient. In the setting of post-menopausal bleeding, endometrial sampling is indicated to exclude carcinoma. If results are benign, sonohysterogram should be considered for focal lesion work-up. (Ref: Radiological Reasoning: Algorithmic Workup of Abnormal Vaginal Bleeding with Endovaginal Sonography and Sonohysterography. AJR 2008; 604:V40-98; 191:S68-73)  Endometrial Biopsy After discussion with the patient regarding her abnormal uterine bleeding I recommended that she proceed with an endometrial biopsy for further diagnosis. The risks, benefits, alternatives, and indications for an endometrial biopsy were discussed with the patient in detail. She understood the risks including infection, bleeding, cervical laceration and uterine perforation.  Verbal consent was obtained.   PROCEDURE NOTE:  Pipelle endometrial biopsy was performed using aseptic technique with iodine preparation.  The uterus was sounded to a length of 7.5 cm.  Adequate sampling was obtained (though sample was sparse) with minimal blood loss.  The patient tolerated the procedure well.  Disposition will be pending pathology.   Assessment: 51 y.o. [id number] with postmenopausal bleeding and suprapubic abdominal pain.    Plan: 1. Postmenopausal bleeding - Endometrial biopsy done today. Will send for pathology. - IGP,CtNg,AptimaHPV,rfx16/18,45 (pap smear with HPV and gonorrhea/chlamydia testing) - Urine culture to assess for cure.   2. Suprapubic abdominal pain - Pathology (as above) - IGP,CtNg,AptimaHPV,rfx16/18,45 (pap and STD testing as above) - Urine culture (as above) - oxyCODONE-acetaminophen  (PERCOCET/ROXICET) 5-325 MG tablet; Take 1 tablet by mouth every 6 (six) hours as needed for severe pain.  Dispense: 12 tablet; Refill: 0    (temporary for pain).  Follow up based on above results. If all results normal, consider SIS for focal lesions.  Thomasene MohairStephen Kitt Minardi, MD 11/09/2016 1:34 PM

## 2016-11-11 LAB — PATHOLOGY

## 2016-11-13 LAB — URINE CULTURE

## 2016-11-14 LAB — IGP,CTNG,APTIMAHPV,RFX16/18,45
Chlamydia, Nuc. Acid Amp: NEGATIVE
Gonococcus by Nucleic Acid Amp: NEGATIVE
HPV APTIMA: NEGATIVE
PAP SMEAR COMMENT: 0

## 2016-11-17 ENCOUNTER — Telehealth: Payer: Self-pay

## 2016-11-17 DIAGNOSIS — N3000 Acute cystitis without hematuria: Secondary | ICD-10-CM

## 2016-11-17 DIAGNOSIS — N390 Urinary tract infection, site not specified: Secondary | ICD-10-CM | POA: Insufficient documentation

## 2016-11-17 MED ORDER — NITROFURANTOIN MONOHYD MACRO 100 MG PO CAPS: 100.0000 mg | ORAL_CAPSULE | Freq: Two times a day (BID) | ORAL | 1 refills | Status: DC

## 2016-11-17 NOTE — Telephone Encounter (Signed)
Pt calling for bx results; still has pain and thinks still has bladder/kidney inf.  Please call. [phone]

## 2016-11-17 NOTE — Telephone Encounter (Signed)
Spoke with patient to relay results. Will treat UTI with macrobid as she has no listed allergies to this. She requested more percocet, which I declined. Will send in macrobid and see if she gets improvement in her pain symptoms.

## 2016-11-21 ENCOUNTER — Emergency Department
Admission: EM | Admit: 2016-11-21 | Discharge: 2016-11-21 | Disposition: A | Discharge status: Home / Self Care | Payer: Self-pay | Attending: Emergency Medicine | Admitting: Emergency Medicine

## 2016-11-21 ENCOUNTER — Encounter: Payer: Self-pay | Admitting: *Deleted

## 2016-11-21 DIAGNOSIS — Z79899 Other long term (current) drug therapy: Secondary | ICD-10-CM | POA: Insufficient documentation

## 2016-11-21 DIAGNOSIS — B9689 Other specified bacterial agents as the cause of diseases classified elsewhere: Secondary | ICD-10-CM

## 2016-11-21 DIAGNOSIS — N390 Urinary tract infection, site not specified: Secondary | ICD-10-CM

## 2016-11-21 DIAGNOSIS — Z791 Long term (current) use of non-steroidal anti-inflammatories (NSAID): Secondary | ICD-10-CM | POA: Insufficient documentation

## 2016-11-21 DIAGNOSIS — I10 Essential (primary) hypertension: Secondary | ICD-10-CM | POA: Insufficient documentation

## 2016-11-21 DIAGNOSIS — N72 Inflammatory disease of cervix uteri: Secondary | ICD-10-CM

## 2016-11-21 DIAGNOSIS — F17210 Nicotine dependence, cigarettes, uncomplicated: Secondary | ICD-10-CM | POA: Insufficient documentation

## 2016-11-21 DIAGNOSIS — N760 Acute vaginitis: Secondary | ICD-10-CM | POA: Insufficient documentation

## 2016-11-21 LAB — COMPREHENSIVE METABOLIC PANEL
ALK PHOS: 85 U/L (ref 38–126)
ALT: 54 U/L (ref 14–54)
ANION GAP: 7 (ref 5–15)
AST: 52 U/L — ABNORMAL HIGH (ref 15–41)
Albumin: 3.7 g/dL (ref 3.5–5.0)
BUN: 8 mg/dL (ref 6–20)
CALCIUM: 9 mg/dL (ref 8.9–10.3)
CO2: 27 mmol/L (ref 22–32)
CREATININE: 0.73 mg/dL (ref 0.44–1.00)
Chloride: 103 mmol/L (ref 101–111)
Glucose, Bld: 150 mg/dL — ABNORMAL HIGH (ref 65–99)
Potassium: 3.7 mmol/L (ref 3.5–5.1)
Sodium: 137 mmol/L (ref 135–145)
Total Bilirubin: 0.8 mg/dL (ref 0.3–1.2)
Total Protein: 7.6 g/dL (ref 6.5–8.1)

## 2016-11-21 LAB — WET PREP, GENITAL
Sperm: NONE SEEN
Trich, Wet Prep: NONE SEEN
Yeast Wet Prep HPF POC: NONE SEEN

## 2016-11-21 LAB — CHLAMYDIA/NGC RT PCR (ARMC ONLY)
CHLAMYDIA TR: NOT DETECTED
N GONORRHOEAE: NOT DETECTED

## 2016-11-21 LAB — CBC
HCT: 42 % (ref 35.0–47.0)
HEMOGLOBIN: 14.2 g/dL (ref 12.0–16.0)
MCH: 28.5 pg (ref 26.0–34.0)
MCHC: 33.7 g/dL (ref 32.0–36.0)
MCV: 84.5 fL (ref 80.0–100.0)
PLATELETS: 280 10*3/uL (ref 150–440)
RBC: 4.97 MIL/uL (ref 3.80–5.20)
RDW: 15.2 % — ABNORMAL HIGH (ref 11.5–14.5)
WBC: 13.4 10*3/uL — AB (ref 3.6–11.0)

## 2016-11-21 LAB — URINALYSIS, COMPLETE (UACMP) WITH MICROSCOPIC
BILIRUBIN URINE: NEGATIVE
Glucose, UA: NEGATIVE mg/dL
HGB URINE DIPSTICK: NEGATIVE
KETONES UR: NEGATIVE mg/dL
NITRITE: POSITIVE — AB
PROTEIN: NEGATIVE mg/dL
Specific Gravity, Urine: 1.019 (ref 1.005–1.030)
pH: 6 (ref 5.0–8.0)

## 2016-11-21 MED ORDER — NITROFURANTOIN MONOHYD MACRO 100 MG PO CAPS: 100.0000 mg | ORAL_CAPSULE | Freq: Two times a day (BID) | ORAL | 0 refills | Status: AC

## 2016-11-21 MED ORDER — ONDANSETRON 4 MG PO TBDP
4.0000 mg | ORAL_TABLET | Freq: Once | ORAL | Status: AC
  Administered 2016-11-21: 4 mg via ORAL

## 2016-11-21 MED ORDER — OXYCODONE-ACETAMINOPHEN 5-325 MG PO TABS
1.0000 | ORAL_TABLET | Freq: Once | ORAL | Status: AC
  Administered 2016-11-21: 1 via ORAL

## 2016-11-21 MED ORDER — NITROFURANTOIN MONOHYD MACRO 100 MG PO CAPS
100.0000 mg | ORAL_CAPSULE | Freq: Once | ORAL | Status: AC
  Administered 2016-11-21: 100 mg via ORAL
  Filled 2016-11-21 (×2): qty 1

## 2016-11-21 MED ORDER — AZITHROMYCIN 250 MG PO TABS
ORAL_TABLET | ORAL | Status: AC
  Filled 2016-11-21: qty 8

## 2016-11-21 MED ORDER — METRONIDAZOLE 500 MG PO TABS: 500.0000 mg | ORAL_TABLET | Freq: Two times a day (BID) | ORAL | 0 refills | Status: AC

## 2016-11-21 MED ORDER — OXYCODONE-ACETAMINOPHEN 5-325 MG PO TABS
ORAL_TABLET | ORAL | Status: AC
  Administered 2016-11-21: 1 via ORAL
  Filled 2016-11-21: qty 1

## 2016-11-21 MED ORDER — AZITHROMYCIN 1 G PO PACK
2.0000 g | PACK | Freq: Once | ORAL | Status: DC
  Filled 2016-11-21: qty 2

## 2016-11-21 MED ORDER — ACETAMINOPHEN 325 MG PO TABS
650.0000 mg | ORAL_TABLET | Freq: Once | ORAL | Status: AC
  Administered 2016-11-21: 650 mg via ORAL
  Filled 2016-11-21: qty 2

## 2016-11-21 MED ORDER — AZITHROMYCIN 250 MG PO TABS
2000.0000 mg | ORAL_TABLET | Freq: Once | ORAL | Status: AC
  Administered 2016-11-21: 2000 mg via ORAL

## 2016-11-21 NOTE — ED Notes (Signed)
Pelvic exam performed by Dr Pershing Proud with this RN present

## 2016-11-21 NOTE — ED Notes (Addendum)
Pt reports pelvic/low midline abd pain for about 2 months; has been seen by a GYN and had cervical biopsy on 11/09/16; pt does not know results; pt adds her sexual partner told her 3 days ago he has chlamydia; pt adds brownish vaginal discharge-but says she had heavy vaginal bleeding yesterday; nausea, no vomiting; denies dysuria

## 2016-11-21 NOTE — ED Triage Notes (Signed)
Pt states biopsy on her uterus on March 27th, pt states has been having "heavy bleeding" vaginally, lower abdominal pain, back pain, fatigue. Pt also states one of her partners said that he had chlamydia.

## 2016-11-21 NOTE — ED Provider Notes (Signed)
[facility] Emergency Department Provider Note  ____________________________________________   First MD Initiated Contact with Patient 11/21/16 1958     (approximate)  I have reviewed the triage vital signs and the nursing notes.   HISTORY  Chief Complaint Abdominal Pain   HPI Robin Conway is a 51 y.o. female with a history of chronic abdominal pain who is presenting to emergency department with lower abdominal pain over the past 2 months. She says that she had an episode of copious bleeding yesterday. Denies any clots. Says the pain is 10 out of 10 at this time and nonradiating. Also says that her partner told her that he was positive for chlamydia several days ago. Patient denies any vomiting but she is reporting nausea at this time. Says that she has a history of an appendectomy.   Past Medical History:  Diagnosis Date  . Bipolar 1 disorder, depressed, severe (HCC)   . Degenerative disc disease, lumbar   . Hypertension   . Mitral valve disorder     Patient Active Problem List   Diagnosis Date Noted  . UTI (urinary tract infection) 11/17/2016  . Postmenopausal bleeding 11/09/2016  . Suprapubic abdominal pain 11/09/2016  . Fall 07/30/2016  . Acute midline low back pain without sciatica 07/30/2016    Past Surgical History:  Procedure Laterality Date  . FRACTURE SURGERY    . TUBAL LIGATION      Prior to Admission medications   Medication Sig Start Date End Date Taking? Authorizing Provider  albuterol (PROVENTIL HFA;VENTOLIN HFA) 108 (90 BASE) MCG/ACT inhaler Inhale 2 puffs into the lungs every 4 (four) hours as needed for wheezing or shortness of breath. 07/15/15  Yes Christiane Ha D Cuthriell, PA-C  FLUoxetine (PROZAC) 20 MG tablet Take 60 mg by mouth daily.   Yes Historical Provider, MD  gabapentin (NEURONTIN) 100 MG capsule Take 1 capsule (100 mg total) by mouth 3 (three) times daily. Patient taking differently: Take 600 mg by mouth 3 (three)  times daily.  02/13/16 02/12/17 Yes Tommi Rumps, PA-C  ibuprofen (ADVIL,MOTRIN) 800 MG tablet Take 1 tablet (800 mg total) by mouth every 8 (eight) hours as needed for moderate pain. 09/17/16  Yes Irean Hong, MD  lamoTRIgine (LAMICTAL) 150 MG tablet Take 150 mg by mouth 2 (two) times daily.   Yes Historical Provider, MD  meloxicam (MOBIC) 15 MG tablet Take 15 mg by mouth daily.    Yes Historical Provider, MD    Allergies Penicillins; Doxycycline; Propoxyphene; Sulfa antibiotics; and Tramadol  No family history on file.  Social History Social History  Substance Use Topics  . Smoking status: Current Every Day Smoker    Packs/day: 0.50    Types: Cigarettes  . Smokeless tobacco: Never Used  . Alcohol use No    Review of Systems Constitutional: No fever/chills Eyes: No visual changes. ENT: No sore throat. Cardiovascular: Denies chest pain. Respiratory: Denies shortness of breath. Gastrointestinal:no vomiting.  No diarrhea.  No constipation. Genitourinary: burning with urination Musculoskeletal: as above  Skin: Negative for rash. Neurological: Negative for headaches, focal weakness or numbness.  10-point ROS otherwise negative.  ____________________________________________   PHYSICAL EXAM:  VITAL SIGNS: ED Triage Vitals  Enc Vitals Group     BP 11/21/16 1925 130/75     Pulse Rate 11/21/16 1925 98     Resp 11/21/16 1925 18     Temp 11/21/16 1925 98.6 F (37 C)     Temp Source 11/21/16 1925 Oral  SpO2 11/21/16 1925 98 %     Weight 11/21/16 1926 204 lb (92.5 kg)     Height 11/21/16 1926  (1.626 m)     Head Circumference --      Peak Flow --      Pain Score 11/21/16 1925 5     Pain Loc --      Pain Edu? --      Excl. in GC? --     Constitutional: Alert and oriented. Well appearing and in no acute distress. Eyes: Conjunctivae are normal. PERRL. EOMI. Head: Atraumatic. Nose: No congestion/rhinnorhea. Mouth/Throat: Mucous membranes are moist.   Neck: No  stridor.   Cardiovascular: Normal rate, regular rhythm. Grossly normal heart sounds.   Respiratory: Normal respiratory effort.  No retractions. Lungs CTAB. Gastrointestinal: Soft with moderate suprapubic tenderness to palpation. No distention. No CVA ttp.  Genitourinary:  Normal external exam. Speculum exam with white, moderate amount of discharge. Bimanual exam with tenderness over the suprapubic region but without any CMT. No use uterine or adnexal masses. No adnexal tenderness palpation. Musculoskeletal: No lower extremity tenderness nor edema.  No joint effusions. Neurologic:  Normal speech and language. No gross focal neurologic deficits are appreciated. No gait instability. Skin:  Skin is warm, dry and intact. No rash noted. Psychiatric: Mood and affect are normal. Speech and behavior are normal.  ____________________________________________   LABS (all labs ordered are listed, but only abnormal results are displayed)  Labs Reviewed  WET PREP, GENITAL - Abnormal; Notable for the following:       Result Value   Clue Cells Wet Prep HPF POC PRESENT (*)    WBC, Wet Prep HPF POC FEW (*)    All other components within normal limits  COMPREHENSIVE METABOLIC PANEL - Abnormal; Notable for the following:    Glucose, Bld 150 (*)    AST 52 (*)    All other components within normal limits  CBC - Abnormal; Notable for the following:    WBC 13.4 (*)    RDW 15.2 (*)    All other components within normal limits  URINALYSIS, COMPLETE (UACMP) WITH MICROSCOPIC - Abnormal; Notable for the following:    Color, Urine YELLOW (*)    APPearance CLOUDY (*)    Nitrite POSITIVE (*)    Leukocytes, UA TRACE (*)    Bacteria, UA MANY (*)    Squamous Epithelial / LPF 6-30 (*)    All other components within normal limits  CHLAMYDIA/NGC RT PCR ([facility] ONLY)  URINE CULTURE    ____________________________________________  EKG   ____________________________________________  RADIOLOGY   ____________________________________________   PROCEDURES  Procedure(s) performed:   Procedures  Critical Care performed:   ____________________________________________   INITIAL IMPRESSION / ASSESSMENT AND PLAN / ED COURSE  Pertinent labs & imaging results that were available during my care of the patient were reviewed by me and considered in my medical decision making (see chart for details).  ----------------------------------------- 9:21 PM on 11/21/2016 -----------------------------------------  Patient without any distress at this time. Positive for UTI as well as clue cells and a few WBCs. Known coronary exposure. Also significant is a penicillin allergy with closure of her airway. She will be given 2 g of azithromycin. She is to follow up with her OB/GYN in one week for further STD testing. She also knows that she must not engage in any sexual activity until she has been fully tested and treated for other STDs including HIV, syphilis and herpes. She does that she was  not have any sexual contact as well until cleared by a provider to resumes her. She knows that this also goes for her partner.      ____________________________________________   FINAL CLINICAL IMPRESSION(S) / ED DIAGNOSES  Cervicitis. Bacterial vaginosis. UTI.    NEW MEDICATIONS STARTED DURING THIS VISIT:  New Prescriptions   No medications on file     Note:  This document was prepared using Dragon voice recognition software and may include unintentional dictation errors.    Myrna Blazer, MD 11/21/16 2123

## 2016-11-22 ENCOUNTER — Telehealth: Payer: Self-pay | Admitting: Obstetrics and Gynecology

## 2016-11-22 NOTE — Telephone Encounter (Signed)
patient is requesting some pain medication. She went to the ER lastnight and has an appt with you on 11/29/2016. Please call.

## 2016-11-24 ENCOUNTER — Emergency Department
Admission: EM | Admit: 2016-11-24 | Discharge: 2016-11-24 | Disposition: A | Discharge status: Home / Self Care | Payer: Self-pay | Attending: Emergency Medicine | Admitting: Emergency Medicine

## 2016-11-24 ENCOUNTER — Encounter: Payer: Self-pay | Admitting: Emergency Medicine

## 2016-11-24 DIAGNOSIS — R1030 Lower abdominal pain, unspecified: Secondary | ICD-10-CM

## 2016-11-24 DIAGNOSIS — I10 Essential (primary) hypertension: Secondary | ICD-10-CM | POA: Insufficient documentation

## 2016-11-24 DIAGNOSIS — N760 Acute vaginitis: Secondary | ICD-10-CM | POA: Insufficient documentation

## 2016-11-24 DIAGNOSIS — B9689 Other specified bacterial agents as the cause of diseases classified elsewhere: Secondary | ICD-10-CM

## 2016-11-24 DIAGNOSIS — N390 Urinary tract infection, site not specified: Secondary | ICD-10-CM | POA: Insufficient documentation

## 2016-11-24 DIAGNOSIS — F17210 Nicotine dependence, cigarettes, uncomplicated: Secondary | ICD-10-CM | POA: Insufficient documentation

## 2016-11-24 DIAGNOSIS — Z79899 Other long term (current) drug therapy: Secondary | ICD-10-CM | POA: Insufficient documentation

## 2016-11-24 MED ORDER — NAPROXEN 500 MG PO TABS: 500.0000 mg | ORAL_TABLET | Freq: Two times a day (BID) | ORAL | 0 refills | Status: DC

## 2016-11-24 MED ORDER — ACETAMINOPHEN 500 MG PO TABS
1000.0000 mg | ORAL_TABLET | Freq: Once | ORAL | Status: DC
  Filled 2016-11-24: qty 2

## 2016-11-24 MED ORDER — KETOROLAC TROMETHAMINE 60 MG/2ML IM SOLN
15.0000 mg | Freq: Once | INTRAMUSCULAR | Status: DC
  Filled 2016-11-24: qty 2

## 2016-11-24 MED ORDER — DIPHENHYDRAMINE HCL 25 MG PO CAPS: 50.0000 mg | ORAL_CAPSULE | Freq: Every evening | ORAL | 0 refills | Status: AC | PRN

## 2016-11-24 MED ORDER — CIPROFLOXACIN HCL 500 MG PO TABS: 500.0000 mg | ORAL_TABLET | Freq: Two times a day (BID) | ORAL | 0 refills | Status: DC

## 2016-11-24 NOTE — ED Notes (Signed)
Pt refusing tylenol, asking for something else. Pt states she is allergic to toradol so that was added to allergy list by this RN.

## 2016-11-24 NOTE — ED Triage Notes (Signed)
Pt seen here on Sunday dx with UTI and Cervicitis. Pt reports continued pain, has contacted dr Jean Rosenthal and cannot be seen until next week, pt reports continued pain.

## 2016-11-24 NOTE — Telephone Encounter (Signed)
Pt is calling back with  alot of pain And would like to be seen more sooner. Maybe today please advise

## 2016-11-24 NOTE — ED Notes (Signed)
Pt states she was seen in ED Sunday night and given abx, states continued pain around her cervix and vagina, states she now has a yeast infection, states she has been taking abx, denies any new discharge, pt awake and alert in no acute distress, states some vaginal spotting

## 2016-11-24 NOTE — Telephone Encounter (Signed)
Pt advised to follow up on schedule appt on Monday with Dr. Jean Rosenthal.

## 2016-11-24 NOTE — ED Notes (Signed)
Spoke with Dr Scotty Court about pt's complaint, no new orders at this time.

## 2016-11-24 NOTE — Telephone Encounter (Signed)
Please advise ER records

## 2016-11-24 NOTE — ED Provider Notes (Signed)
[facility] Emergency Department Provider Note  ____________________________________________  Time seen: Approximately 7:14 PM  I have reviewed the triage vital signs and the nursing notes.   HISTORY  Chief Complaint Abdominal Pain    HPI Robin Conway is a 51 y.o. female who complains of pelvic pain going on for the past several days. She was seen in the ED 3 days ago and diagnosed with bacterial vaginosis and urinary tract infection. She was prescribed Macrobid and Flagyl for this. She reports taking them but without any improvement in her pain. She also feels like she is developing a yeast infection now. No worsening of the pain. No fevers or chills. No vomiting. No chest pain or shortness of breath. No other symptoms at this time. No vaginal bleeding.     Past Medical History:  Diagnosis Date  . Bipolar 1 disorder, depressed, severe (HCC)   . Degenerative disc disease, lumbar   . Hypertension   . Mitral valve disorder      Patient Active Problem List   Diagnosis Date Noted  . UTI (urinary tract infection) 11/17/2016  . Postmenopausal bleeding 11/09/2016  . Suprapubic abdominal pain 11/09/2016  . Fall 07/30/2016  . Acute midline low back pain without sciatica 07/30/2016     Past Surgical History:  Procedure Laterality Date  . FRACTURE SURGERY    . TUBAL LIGATION       Prior to Admission medications   Medication Sig Start Date End Date Taking? Authorizing Provider  albuterol (PROVENTIL HFA;VENTOLIN HFA) 108 (90 BASE) MCG/ACT inhaler Inhale 2 puffs into the lungs every 4 (four) hours as needed for wheezing or shortness of breath. 07/15/15   Delorise Royals Cuthriell, PA-C  ciprofloxacin (CIPRO) 500 MG tablet Take 1 tablet (500 mg total) by mouth 2 (two) times daily. 11/24/16   Sharman Cheek, MD  FLUoxetine (PROZAC) 20 MG tablet Take 60 mg by mouth daily.    Historical Provider, MD  gabapentin (NEURONTIN) 100 MG capsule Take 1 capsule (100 mg  total) by mouth 3 (three) times daily. Patient taking differently: Take 600 mg by mouth 3 (three) times daily.  02/13/16 02/12/17  Tommi Rumps, PA-C  ibuprofen (ADVIL,MOTRIN) 800 MG tablet Take 1 tablet (800 mg total) by mouth every 8 (eight) hours as needed for moderate pain. 09/17/16   Irean Hong, MD  lamoTRIgine (LAMICTAL) 150 MG tablet Take 150 mg by mouth 2 (two) times daily.    Historical Provider, MD  meloxicam (MOBIC) 15 MG tablet Take 15 mg by mouth daily.     Historical Provider, MD  metroNIDAZOLE (FLAGYL) 500 MG tablet Take 1 tablet (500 mg total) by mouth 2 (two) times daily. 11/21/16 12/05/16  Myrna Blazer, MD  naproxen (NAPROSYN) 500 MG tablet Take 1 tablet (500 mg total) by mouth 2 (two) times daily with a meal. 11/24/16   Sharman Cheek, MD  nitrofurantoin, macrocrystal-monohydrate, (MACROBID) 100 MG capsule Take 1 capsule (100 mg total) by mouth 2 (two) times daily. 11/21/16 11/28/16  Myrna Blazer, MD     Allergies Penicillins; Doxycycline; Propoxyphene; Sulfa antibiotics; Toradol [ketorolac tromethamine]; and Tramadol   No family history on file.  Social History Social History  Substance Use Topics  . Smoking status: Current Every Day Smoker    Packs/day: 0.50    Types: Cigarettes  . Smokeless tobacco: Never Used  . Alcohol use No    Review of Systems  Constitutional:   No fever or chills.  ENT:  No sore throat. No rhinorrhea. Cardiovascular:   No chest pain. Respiratory:   No dyspnea or cough. Gastrointestinal:   Positive as above for abdominal pain without vomiting and diarrhea.  Genitourinary:   Positive dysuria. Musculoskeletal:   Negative for focal pain or swelling Neurological:   Negative for headaches 10-point ROS otherwise negative.  ____________________________________________   PHYSICAL EXAM:  VITAL SIGNS: ED Triage Vitals  Enc Vitals Group     BP 11/24/16 1544 (!) 141/90     Pulse Rate 11/24/16 1544 80     Resp 11/24/16  1544 16     Temp 11/24/16 1544 98.1 F (36.7 C)     Temp Source 11/24/16 1544 Oral     SpO2 11/24/16 1544 96 %     Weight 11/24/16 1544 204 lb (92.5 kg)     Height 11/24/16 1544  (1.626 m)     Head Circumference --      Peak Flow --      Pain Score 11/24/16 1543 5     Pain Loc --      Pain Edu? --      Excl. in GC? --     Vital signs reviewed, nursing assessments reviewed.   Constitutional:   Alert and oriented. Well appearing and in no distress. Eyes:   No scleral icterus. No conjunctival pallor. PERRL. EOMI.  No nystagmus. ENT   Head:   Normocephalic and atraumatic.   Nose:   No congestion/rhinnorhea. No septal hematoma   Mouth/Throat:   MMM, no pharyngeal erythema. No peritonsillar mass.    Neck:   No stridor. No SubQ emphysema. No meningismus. Hematological/Lymphatic/Immunilogical:   No cervical lymphadenopathy. Cardiovascular:   RRR. Symmetric bilateral radial and DP pulses.  No murmurs.  Respiratory:   Normal respiratory effort without tachypnea nor retractions. Breath sounds are clear and equal bilaterally. No wheezes/rales/rhonchi. Gastrointestinal:   Soft with suprapubic tenderness. Non distended. There is no CVA tenderness.  No rebound, rigidity, or guarding. Genitourinary:   deferred Musculoskeletal:   Normal range of motion in all extremities. No joint effusions.  No lower extremity tenderness.  No edema. Neurologic:   Normal speech and language.  CN 2-10 normal. Motor grossly intact. No gross focal neurologic deficits are appreciated.  Skin:    Skin is warm, dry and intact. No rash noted.  No petechiae, purpura, or bullae.  ____________________________________________    LABS (pertinent positives/negatives) (all labs ordered are listed, but only abnormal results are displayed) Labs Reviewed - No data to display ____________________________________________   EKG    ____________________________________________    RADIOLOGY  No  results found.  ____________________________________________   PROCEDURES Procedures  ____________________________________________   INITIAL IMPRESSION / ASSESSMENT AND PLAN / ED COURSE  Pertinent labs & imaging results that were available during my care of the patient were reviewed by me and considered in my medical decision making (see chart for details).  Patient presents with ongoing abdominal pain with recent ED workup that was positive for urinary tract infection and bacterial vaginosis. Urine culture that was begun 3 days ago is positive for Escherichia coli in the urine although antibiotics susceptibilities are not resulted yet. Looking back a month ago, she previously has had Escherichia coli infections in the urine and susceptibilities showed that it is sensitive to everything except Macrobid. Given this, we will change her antibiotic to Cipro as this is one of the few options allow for by her allergy profile.  Advised patient to continue Flagyl. Advised patient that  over-the-counter Monistat will likely help with her yeast infection symptoms, but she may need to use it again after she completes the antibiotics. Offered patient Tylenol and Toradol for her pain but she refuses and requests opioid medications which we will defer on for now. She has a follow-up appointment with Dr. Jean Rosenthal in 4 or 5 days.         ____________________________________________   FINAL CLINICAL IMPRESSION(S) / ED DIAGNOSES  Final diagnoses:  Lower abdominal pain  Lower urinary tract infectious disease  Bacterial vaginosis      New Prescriptions   CIPROFLOXACIN (CIPRO) 500 MG TABLET    Take 1 tablet (500 mg total) by mouth 2 (two) times daily.   NAPROXEN (NAPROSYN) 500 MG TABLET    Take 1 tablet (500 mg total) by mouth 2 (two) times daily with a meal.     Portions of this note were generated with dragon dictation software. Dictation errors may occur despite best attempts at  proofreading.    Sharman Cheek, MD 11/24/16 [phone]

## 2016-11-25 LAB — URINE CULTURE: Culture: >=100000 — AB

## 2016-11-25 MED ORDER — NICARDIPINE HCL IN NACL 20-0.86 MG/200ML-% IV SOLN
INTRAVENOUS | Status: AC
  Filled 2016-11-25: qty 200

## 2016-11-29 ENCOUNTER — Encounter: Payer: Self-pay | Admitting: Obstetrics and Gynecology

## 2016-11-29 ENCOUNTER — Ambulatory Visit (INDEPENDENT_AMBULATORY_CARE_PROVIDER_SITE_OTHER): Payer: Self-pay | Admitting: Obstetrics and Gynecology

## 2016-11-29 VITALS — BP 118/76 | Ht 64.0 in | Wt 207.0 lb

## 2016-11-29 DIAGNOSIS — N3000 Acute cystitis without hematuria: Secondary | ICD-10-CM

## 2016-11-29 DIAGNOSIS — R102 Pelvic and perineal pain: Secondary | ICD-10-CM

## 2016-11-29 DIAGNOSIS — N950 Postmenopausal bleeding: Secondary | ICD-10-CM

## 2016-11-29 MED ORDER — ACETAMINOPHEN-CODEINE #3 300-30 MG PO TABS: 1.0000 | ORAL_TABLET | ORAL | 0 refills | Status: DC | PRN

## 2016-11-29 NOTE — Progress Notes (Signed)
Obstetrics & Gynecology Office Visit   Chief Complaint  Patient presents with  . Urinary Tract Infection  . ER Follow Up  . Pelvic Pain   History of Present Illness: 51 y.o. G47P3003 female who returns for follow up for abdominal/pelvic pain and postmenopausal bleeding. She states that she finished her course of antibiotics for her UTI (macrobid) and was given cipro in the ER on a visit on 4/8 due to the infection not being fully treated (unclear).  SHe states that she was able to finish a few days worth, but not the entire course.  Her symptoms of pain are still present. She has had another episode of vaginal bleeding since her last visit. None currently.    Review of Systems: Review of Systems  Constitutional: Negative.   HENT: Negative.   Eyes: Negative.   Respiratory: Negative.   Cardiovascular: Negative.   Gastrointestinal: Positive for abdominal pain.  Genitourinary: Negative.        Intermittent vaginal bleeding  Musculoskeletal: Negative.   Skin: Negative.   Neurological: Negative.   Psychiatric/Behavioral: Negative.     Past Medical History:  Diagnosis Date  . Bipolar 1 disorder, depressed, severe (HCC)   . Degenerative disc disease, lumbar   . Hypertension   . Mitral valve disorder     Past Surgical History:  Procedure Laterality Date  . FRACTURE SURGERY    . TUBAL LIGATION      Gynecologic History: No LMP recorded. Patient is postmenopausal.  Obstetric History: [id number]  Family History: no gyn cancer  Social History   Social History  . Marital status: Legally Separated    Spouse name: N/A  . Number of children: N/A  . Years of education: N/A   Occupational History  . Not on file.   Social History Main Topics  . Smoking status: Current Every Day Smoker    Packs/day: 0.50    Types: Cigarettes  . Smokeless tobacco: Never Used  . Alcohol use No  . Drug use: No  . Sexual activity: Yes    Birth control/ protection: Post-menopausal   Other  Topics Concern  . Not on file   Social History Narrative  . No narrative on file    Allergies  Allergen Reactions  . Penicillins Anaphylaxis    Has patient had a PCN reaction causing immediate rash, facial/tongue/throat swelling, SOB or lightheadedness with hypotension: Yes Has patient had a PCN reaction causing severe rash involving mucus membranes or skin necrosis: No Has patient had a PCN reaction that required hospitalization No Has patient had a PCN reaction occurring within the last 10 years: No If all of the above answers are "NO", then may proceed with Cephalosporin use.   Marland Kitchen Doxycycline Swelling  . Propoxyphene Other (See Comments)    darvocet  . Sulfa Antibiotics Swelling  . Toradol [Ketorolac Tromethamine]   . Tramadol Itching    Medications:   Medication Sig Start Date End Date Taking? Authorizing Provider  FLUoxetine (PROZAC) 20 MG tablet Take 60 mg by mouth daily.   Yes Historical Provider, MD  ibuprofen (ADVIL,MOTRIN) 800 MG tablet Take 1 tablet (800 mg total) by mouth every 8 (eight) hours as needed for moderate pain. 09/17/16  Yes Irean Hong, MD  lamoTRIgine (LAMICTAL) 150 MG tablet Take 150 mg by mouth 2 (two) times daily.   Yes Historical Provider, MD  meloxicam (MOBIC) 15 MG tablet Take 15 mg by mouth daily.    Yes Historical Provider, MD  metroNIDAZOLE (FLAGYL) 500  MG tablet Take 1 tablet (500 mg total) by mouth 2 (two) times daily. 11/21/16 12/05/16 Yes Myrna Blazer, MD  albuterol (PROVENTIL HFA;VENTOLIN HFA) 108 (90 BASE) MCG/ACT inhaler Inhale 2 puffs into the lungs every 4 (four) hours as needed for wheezing or shortness of breath. Patient not taking: Reported on 11/29/2016 07/15/15   Delorise Royals Cuthriell, PA-C  ciprofloxacin (CIPRO) 500 MG tablet Take 1 tablet (500 mg total) by mouth 2 (two) times daily. Patient not taking: Reported on 11/29/2016 11/24/16   Sharman Cheek, MD  diphenhydrAMINE (BENADRYL) 25 mg capsule Take 2 capsules (50 mg total) by  mouth at bedtime as needed for sleep. Patient not taking: Reported on 11/29/2016 11/24/16   Sharman Cheek, MD  gabapentin (NEURONTIN) 100 MG capsule Take 1 capsule (100 mg total) by mouth 3 (three) times daily. Patient not taking: Reported on 11/29/2016 02/13/16 02/12/17  Tommi Rumps, PA-C  naproxen (NAPROSYN) 500 MG tablet Take 1 tablet (500 mg total) by mouth 2 (two) times daily with a meal. Patient not taking: Reported on 11/29/2016 11/24/16   Sharman Cheek, MD    Physical Exam BP 118/76   Ht  (1.626 m)   Wt 207 lb (93.9 kg)   BMI 35.53 kg/m   No LMP recorded. Patient is postmenopausal.  Physical Exam  Constitutional: She is well-developed, well-nourished, and in no distress. No distress.  HENT:  Head: Normocephalic and atraumatic.  Eyes: Conjunctivae are normal. No scleral icterus.  Neurological: She is alert. No cranial nerve deficit.  Psychiatric: Mood, affect and judgment normal.    Assessment: 51 y.o. [id number] with abdominal/pelvic pain and postmenopausal bleeding.  Plan: 1) pain: no clear source.  Will send urine culture to see if fully treated. She has not be able to finish really any course of antibiotics for this, it appears. Her last urine culture showed sensitivity to macrobid.  This is the most likely suspect for her pain right now.    2) postmenopausal bleeding: normal pap smear and endometrial biopsy.   SIS as next step to assess for focal lesions.  20 minutes spent in face to face discussion with > 50% spent in counseling and management of her abdominal and pelvic pain and postmenopausal bleeding.   [name], MD 11/29/2016 1:50 PM

## 2016-12-01 ENCOUNTER — Telehealth: Payer: Self-pay

## 2016-12-01 LAB — URINE CULTURE

## 2016-12-01 NOTE — Telephone Encounter (Signed)
Pt aware of negative results.

## 2016-12-01 NOTE — Telephone Encounter (Signed)
-----   Message from Conard Novak, MD sent at 12/01/2016 12:20 PM EDT ----- Would you mind calling Ms Huizenga and letting her know that the urine culture returned as negative?  She would want to know.  Thank you!

## 2016-12-06 ENCOUNTER — Other Ambulatory Visit: Payer: Self-pay

## 2016-12-06 NOTE — Telephone Encounter (Signed)
Not sure why this got forwarded to me after Harris's answer

## 2016-12-06 NOTE — Telephone Encounter (Signed)
Pt requesting [facility] to refill Tylenol #3. She has started her menses & is bleeding pretty heavy & cramping really bad. [phone].

## 2016-12-06 NOTE — Telephone Encounter (Signed)
Send to Dr Jean Rosenthal.  We dont refill pain meds without an appointment unless well know to provider.

## 2016-12-07 ENCOUNTER — Telehealth: Payer: Self-pay | Admitting: Obstetrics and Gynecology

## 2016-12-07 NOTE — Telephone Encounter (Signed)
Please call Robin Conway and let her know that I can not refill this.  We have no identified source for her pain. We can not continue to treat with narcotic pain medication.  I will see her at her next appointment and we can discuss then.  Thank you!

## 2016-12-07 NOTE — Telephone Encounter (Signed)
Pt would like a call from [facility] Dr. Jean Rosenthal Nurse please!

## 2016-12-07 NOTE — Telephone Encounter (Signed)
Called pt again with no answer. Please let me know when she calls back

## 2016-12-07 NOTE — Telephone Encounter (Signed)
Trying to call pt to let her know this. Lm. Let me know when she calls back

## 2016-12-07 NOTE — Telephone Encounter (Signed)
See previous task from [facility]

## 2016-12-08 ENCOUNTER — Encounter: Payer: Self-pay | Admitting: Obstetrics and Gynecology

## 2016-12-08 ENCOUNTER — Telehealth: Payer: Self-pay | Admitting: Obstetrics and Gynecology

## 2016-12-08 ENCOUNTER — Ambulatory Visit (INDEPENDENT_AMBULATORY_CARE_PROVIDER_SITE_OTHER): Payer: Self-pay | Admitting: Obstetrics and Gynecology

## 2016-12-08 VITALS — BP 124/78 | Ht 64.0 in | Wt 204.0 lb

## 2016-12-08 DIAGNOSIS — R102 Pelvic and perineal pain: Secondary | ICD-10-CM

## 2016-12-08 DIAGNOSIS — N950 Postmenopausal bleeding: Secondary | ICD-10-CM

## 2016-12-08 MED ORDER — ACETAMINOPHEN-CODEINE #3 300-30 MG PO TABS: 1.0000 | ORAL_TABLET | Freq: Three times a day (TID) | ORAL | 0 refills | Status: DC | PRN

## 2016-12-08 NOTE — Progress Notes (Signed)
Obstetrics & Gynecology Office Visit   Chief Complaint  Patient presents with  . Follow-up    History of Present Illness: 51 y.o. G73P3003 female who presents in follow up for pelvic pain and postmenopausal bleeding. She is scheduled to undergo a SIS in mid-May. She returns due to more vaginal bleeding over the weekend.  She has had a workup that includes a normal pap smear, a normal endometrial biopsy, and apparently normal ultrasound. She has been to the ER for this.  However, no etiology has been found.    Review of Systems: ROS Negative unless noted in the HPI.  Past Medical History:  Diagnosis Date  . Bipolar 1 disorder, depressed, severe (HCC)   . Degenerative disc disease, lumbar   . Hypertension   . Mitral valve disorder     Past Surgical History:  Procedure Laterality Date  . FRACTURE SURGERY    . TUBAL LIGATION      Gynecologic History: No LMP recorded. Patient is postmenopausal.  Obstetric History: [id number]  Family History: reviewed  Social History   Social History  . Marital status: Legally Separated    Spouse name: N/A  . Number of children: N/A  . Years of education: N/A   Occupational History  . Not on file.   Social History Main Topics  . Smoking status: Current Every Day Smoker    Packs/day: 0.50    Types: Cigarettes  . Smokeless tobacco: Never Used  . Alcohol use No  . Drug use: No  . Sexual activity: Yes    Birth control/ protection: Post-menopausal   Other Topics Concern  . Not on file   Social History Narrative  . No narrative on file    Allergies  Allergen Reactions  . Penicillins Anaphylaxis    Has patient had a PCN reaction causing immediate rash, facial/tongue/throat swelling, SOB or lightheadedness with hypotension: Yes Has patient had a PCN reaction causing severe rash involving mucus membranes or skin necrosis: No Has patient had a PCN reaction that required hospitalization No Has patient had a PCN reaction occurring  within the last 10 years: No If all of the above answers are "NO", then may proceed with Cephalosporin use.   Marland Kitchen Doxycycline Swelling  . Propoxyphene Other (See Comments)    darvocet  . Sulfa Antibiotics Swelling  . Toradol [Ketorolac Tromethamine]   . Tramadol Itching    Medications:   Medication Sig Start Date End Date Taking? Authorizing Provider  acetaminophen-codeine (TYLENOL #3) 300-30 MG tablet Take 1 tablet by mouth every 8 (eight) hours as needed (breakthrough pain). 12/08/16  Yes Conard Novak, MD  albuterol (PROVENTIL HFA;VENTOLIN HFA) 108 (90 BASE) MCG/ACT inhaler Inhale 2 puffs into the lungs every 4 (four) hours as needed for wheezing or shortness of breath. 07/15/15  Yes Christiane Ha D Cuthriell, PA-C  diphenhydrAMINE (BENADRYL) 25 mg capsule Take 2 capsules (50 mg total) by mouth at bedtime as needed for sleep. 11/24/16  Yes Sharman Cheek, MD  FLUoxetine (PROZAC) 20 MG tablet Take 60 mg by mouth daily.   Yes Historical Provider, MD  gabapentin (NEURONTIN) 100 MG capsule Take 1 capsule (100 mg total) by mouth 3 (three) times daily. 02/13/16 02/12/17 Yes Tommi Rumps, PA-C  ibuprofen (ADVIL,MOTRIN) 800 MG tablet Take 1 tablet (800 mg total) by mouth every 8 (eight) hours as needed for moderate pain. 09/17/16  Yes Irean Hong, MD  lamoTRIgine (LAMICTAL) 150 MG tablet Take 150 mg by mouth 2 (two) times daily.  Yes Historical Provider, MD  meloxicam (MOBIC) 15 MG tablet Take 15 mg by mouth daily.    Yes Historical Provider, MD  naproxen (NAPROSYN) 500 MG tablet Take 1 tablet (500 mg total) by mouth 2 (two) times daily with a meal. 11/24/16  Yes Sharman Cheek, MD  ciprofloxacin (CIPRO) 500 MG tablet Take 1 tablet (500 mg total) by mouth 2 (two) times daily. Patient not taking: Reported on 11/29/2016 11/24/16   Sharman Cheek, MD    Physical Exam Vitals:  Vitals:   12/08/16 0858  BP: 124/78   No LMP recorded. Patient is postmenopausal.  General: NAD HEENT:  normocephalic, anicteric Pulmonary: No increased work of breathing Extremities: no edema, erythema, or tenderness Neurologic: Grossly intact Psychiatric: mood appropriate, affect full  Assessment: 51 y.o. [id number] with abdominal/pelvic pain and postmenopausal bleeding. Given her continued bleeding, I believe a direct visualization of the uterus is now in order. I am concerned for focal malignancy.  Recommend hysteroscoy, D&C as soon as it can be arranged.    Plan: Problem List Items Addressed This Visit    Postmenopausal bleeding - Primary   Suprapubic abdominal pain   Relevant Medications   acetaminophen-codeine (TYLENOL #3) 300-30 MG tablet     15 minutes spent in face to face discussion with > 50% spent in counseling and management of her abdominal/pelvic pain and postmenopausal bleeding.  [name], MD 12/08/2016 1:01 PM

## 2016-12-08 NOTE — Telephone Encounter (Signed)
Pt is calling about her up coming surgery. Pt states Dr. Jean Rosenthal mention this Friday. Would you please call pt back. Thank you

## 2016-12-09 ENCOUNTER — Encounter
Admission: RE | Admit: 2016-12-09 | Discharge: 2016-12-09 | Disposition: A | Discharge status: Home / Self Care | Payer: Self-pay | Source: Ambulatory Visit | Attending: Obstetrics and Gynecology | Admitting: Obstetrics and Gynecology

## 2016-12-09 DIAGNOSIS — Z01810 Encounter for preprocedural cardiovascular examination: Secondary | ICD-10-CM | POA: Insufficient documentation

## 2016-12-09 DIAGNOSIS — I517 Cardiomegaly: Secondary | ICD-10-CM | POA: Insufficient documentation

## 2016-12-09 DIAGNOSIS — Z01812 Encounter for preprocedural laboratory examination: Secondary | ICD-10-CM | POA: Insufficient documentation

## 2016-12-09 DIAGNOSIS — I10 Essential (primary) hypertension: Secondary | ICD-10-CM | POA: Insufficient documentation

## 2016-12-09 HISTORY — DX: Bronchitis, not specified as acute or chronic: J40

## 2016-12-09 HISTORY — DX: Other intervertebral disc degeneration, lumbar region without mention of lumbar back pain or lower extremity pain: M51.369

## 2016-12-09 HISTORY — DX: Gastro-esophageal reflux disease without esophagitis: K21.9

## 2016-12-09 HISTORY — DX: Anxiety disorder, unspecified: F41.9

## 2016-12-09 HISTORY — DX: Other intervertebral disc degeneration, lumbar region: M51.36

## 2016-12-09 HISTORY — DX: Personal history of urinary calculi: Z87.442

## 2016-12-09 HISTORY — DX: Dyspnea, unspecified: R06.00

## 2016-12-09 HISTORY — DX: Irritable bowel syndrome, unspecified: K58.9

## 2016-12-09 LAB — TYPE AND SCREEN
ABO/RH(D): A POS
Antibody Screen: NEGATIVE

## 2016-12-09 NOTE — Patient Instructions (Signed)
  Your procedure is scheduled on: December 10, 2016 (Friday) Report to Same Day Surgery 2nd floor medical mall [facility] Entrance-take elevator on left to 2nd floor.  Check in with surgery information desk.) ARRIVAL TIME 7:30 AM   Remember: Instructions that are not followed completely may result in serious medical risk, up to and including death, or upon the discretion of your surgeon and anesthesiologist your surgery may need to be rescheduled.    _x___ 1. Do not eat food or drink liquids after midnight. No gum chewing or hard candies                               __x__ 2. No Alcohol for 24 hours before or after surgery.   __x__3. No Smoking for 24 prior to surgery.   ____  4. Bring all medications with you on the day of surgery if instructed.    __x__ 5. Notify your doctor if there is any change in your medical condition     (cold, fever, infections).     Do not wear jewelry, make-up, hairpins, clips or nail polish.  Do not wear lotions, powders, or perfumes. You may wear deodorant.  Do not shave 48 hours prior to surgery. Men may shave face and neck.  Do not bring valuables to the hospital.    [facility] is not responsible for any belongings or valuables.               Contacts, dentures or bridgework may not be worn into surgery.  Leave your suitcase in the car. After surgery it may be brought to your room.  For patients admitted to the hospital, discharge time is determined by your  treatment team                      Patients discharged the day of surgery will not be allowed to drive home.  You will need someone to drive you home and stay with you the night of your procedure.    Please read over the following fact sheets that you were given:   [facility] Preparing for Surgery and or MRSA Information   _x___ Take anti-hypertensive (unless it includes a diuretic), cardiac, seizure, asthma,     anti-reflux and psychiatric medicines with a sip of water. These include:  1.  PROZAC  2. LAMICTAL  3. PRILOSEC (PRILOSEC AT BEDTIME TONIGHT)  4. GABAPENTIN  5. AMLODIPINE  6.  ____Fleets enema or Magnesium Citrate as directed.   ___ Use CHG Soap or sage wipes as directed on instruction sheet   ____ Use inhalers on the day of surgery and bring to hospital day of surgery  ____ Stop Metformin and Janumet 2 days prior to surgery.    ____ Take 1/2 of usual insulin dose the night before surgery and none on the morning surgery        _x___ Follow recommendations from Cardiologist, Pulmonologist or PCP regarding          stopping Aspirin, Coumadin, Pllavix ,Eliquis, Effient, or Pradaxa, and Pletal.  X____Stop Anti-inflammatories such as Advil, Aleve, Ibuprofen, Motrin, Naproxen, Naprosyn, Goodies powders or aspirin products. OK to take Tylenol  (STOP IBUPROFEN NOW)    _x___ Stop supplements until after surgery.  But may continue Vitamin D, Vitamin B,  and multivitamin,    .   ____ Bring C-Pap to the hospital.

## 2016-12-09 NOTE — Telephone Encounter (Signed)
Patient seen in clinic yesterday.  This issue is resolved.

## 2016-12-10 ENCOUNTER — Encounter
Admission: RE | Disposition: A | Discharge status: Home / Self Care | Payer: Self-pay | Source: Ambulatory Visit | Attending: Obstetrics and Gynecology

## 2016-12-10 ENCOUNTER — Ambulatory Visit: Payer: Self-pay | Admitting: Anesthesiology

## 2016-12-10 ENCOUNTER — Encounter: Payer: Self-pay | Admitting: *Deleted

## 2016-12-10 ENCOUNTER — Ambulatory Visit
Admission: RE | Admit: 2016-12-10 | Discharge: 2016-12-10 | Disposition: A | Discharge status: Home / Self Care | Payer: Self-pay | Source: Ambulatory Visit | Attending: Obstetrics and Gynecology | Admitting: Obstetrics and Gynecology

## 2016-12-10 ENCOUNTER — Telehealth: Payer: Self-pay | Admitting: Obstetrics and Gynecology

## 2016-12-10 DIAGNOSIS — F17210 Nicotine dependence, cigarettes, uncomplicated: Secondary | ICD-10-CM | POA: Insufficient documentation

## 2016-12-10 DIAGNOSIS — Z880 Allergy status to penicillin: Secondary | ICD-10-CM | POA: Insufficient documentation

## 2016-12-10 DIAGNOSIS — Z881 Allergy status to other antibiotic agents status: Secondary | ICD-10-CM | POA: Insufficient documentation

## 2016-12-10 DIAGNOSIS — R102 Pelvic and perineal pain: Secondary | ICD-10-CM | POA: Diagnosis present

## 2016-12-10 DIAGNOSIS — N840 Polyp of corpus uteri: Secondary | ICD-10-CM

## 2016-12-10 DIAGNOSIS — N950 Postmenopausal bleeding: Secondary | ICD-10-CM

## 2016-12-10 DIAGNOSIS — I059 Rheumatic mitral valve disease, unspecified: Secondary | ICD-10-CM | POA: Insufficient documentation

## 2016-12-10 DIAGNOSIS — I10 Essential (primary) hypertension: Secondary | ICD-10-CM | POA: Insufficient documentation

## 2016-12-10 DIAGNOSIS — Z882 Allergy status to sulfonamides status: Secondary | ICD-10-CM | POA: Insufficient documentation

## 2016-12-10 DIAGNOSIS — M5136 Other intervertebral disc degeneration, lumbar region: Secondary | ICD-10-CM | POA: Insufficient documentation

## 2016-12-10 DIAGNOSIS — F319 Bipolar disorder, unspecified: Secondary | ICD-10-CM | POA: Insufficient documentation

## 2016-12-10 DIAGNOSIS — Z888 Allergy status to other drugs, medicaments and biological substances status: Secondary | ICD-10-CM | POA: Insufficient documentation

## 2016-12-10 DIAGNOSIS — Z79899 Other long term (current) drug therapy: Secondary | ICD-10-CM | POA: Insufficient documentation

## 2016-12-10 HISTORY — PX: DILATATION & CURETTAGE/HYSTEROSCOPY WITH MYOSURE: SHX6511

## 2016-12-10 LAB — POCT PREGNANCY, URINE: PREG TEST UR: NEGATIVE

## 2016-12-10 LAB — ABO/RH: ABO/RH(D): A POS

## 2016-12-10 SURGERY — DILATATION & CURETTAGE/HYSTEROSCOPY WITH MYOSURE
Anesthesia: General | Site: Uterus | Wound class: Clean Contaminated

## 2016-12-10 MED ORDER — MIDAZOLAM HCL 2 MG/2ML IJ SOLN
INTRAMUSCULAR | Status: DC | PRN
  Administered 2016-12-10: 2 mg via INTRAVENOUS

## 2016-12-10 MED ORDER — LIDOCAINE HCL 2 % EX GEL
CUTANEOUS | Status: DC | PRN
  Administered 2016-12-10: 1 via TOPICAL

## 2016-12-10 MED ORDER — LIDOCAINE HCL (PF) 2 % IJ SOLN
INTRAMUSCULAR | Status: AC
  Filled 2016-12-10: qty 2

## 2016-12-10 MED ORDER — PROPOFOL 10 MG/ML IV BOLUS
INTRAVENOUS | Status: AC
  Filled 2016-12-10: qty 40

## 2016-12-10 MED ORDER — ONDANSETRON HCL 4 MG/2ML IJ SOLN
INTRAMUSCULAR | Status: AC
  Filled 2016-12-10: qty 2

## 2016-12-10 MED ORDER — HYDROCODONE-ACETAMINOPHEN 5-325 MG PO TABS: 1.0000 | ORAL_TABLET | Freq: Four times a day (QID) | ORAL | 0 refills | Status: DC | PRN

## 2016-12-10 MED ORDER — LACTATED RINGERS IV SOLN
INTRAVENOUS | Status: DC
  Administered 2016-12-10: 08:00:00 via INTRAVENOUS

## 2016-12-10 MED ORDER — FENTANYL CITRATE (PF) 100 MCG/2ML IJ SOLN
INTRAMUSCULAR | Status: AC
  Filled 2016-12-10: qty 2

## 2016-12-10 MED ORDER — GLYCOPYRROLATE 0.2 MG/ML IJ SOLN
INTRAMUSCULAR | Status: AC
  Filled 2016-12-10: qty 1

## 2016-12-10 MED ORDER — ONDANSETRON HCL 4 MG/2ML IJ SOLN
INTRAMUSCULAR | Status: DC | PRN
  Administered 2016-12-10: 4 mg via INTRAVENOUS

## 2016-12-10 MED ORDER — FENTANYL CITRATE (PF) 100 MCG/2ML IJ SOLN
INTRAMUSCULAR | Status: DC | PRN
  Administered 2016-12-10: 25 ug via INTRAVENOUS
  Administered 2016-12-10: 50 ug via INTRAVENOUS
  Administered 2016-12-10: 25 ug via INTRAVENOUS

## 2016-12-10 MED ORDER — LIDOCAINE HCL 2 % EX GEL
CUTANEOUS | Status: AC
  Filled 2016-12-10: qty 5

## 2016-12-10 MED ORDER — FENTANYL CITRATE (PF) 100 MCG/2ML IJ SOLN
INTRAMUSCULAR | Status: AC
  Administered 2016-12-10: 50 ug via INTRAVENOUS
  Filled 2016-12-10: qty 2

## 2016-12-10 MED ORDER — PROPOFOL 500 MG/50ML IV EMUL
INTRAVENOUS | Status: DC | PRN
Start: 2016-12-10 — End: 2016-12-10
  Administered 2016-12-10: 150 ug/kg/min via INTRAVENOUS

## 2016-12-10 MED ORDER — PROPOFOL 500 MG/50ML IV EMUL
INTRAVENOUS | Status: AC
  Filled 2016-12-10: qty 50

## 2016-12-10 MED ORDER — MIDAZOLAM HCL 2 MG/2ML IJ SOLN
INTRAMUSCULAR | Status: AC
  Filled 2016-12-10: qty 2

## 2016-12-10 MED ORDER — LIDOCAINE HCL (CARDIAC) 20 MG/ML IV SOLN
INTRAVENOUS | Status: DC | PRN
  Administered 2016-12-10: 40 mg via INTRAVENOUS

## 2016-12-10 MED ORDER — FENTANYL CITRATE (PF) 100 MCG/2ML IJ SOLN
25.0000 ug | INTRAMUSCULAR | Status: DC | PRN
  Administered 2016-12-10 (×3): 50 ug via INTRAVENOUS

## 2016-12-10 MED ORDER — SODIUM CHLORIDE 0.9 % IJ SOLN
INTRAMUSCULAR | Status: AC
  Filled 2016-12-10: qty 20

## 2016-12-10 MED ORDER — PROMETHAZINE HCL 25 MG/ML IJ SOLN
6.2500 mg | INTRAMUSCULAR | Status: DC | PRN
  Administered 2016-12-10: 12.5 mg via INTRAVENOUS

## 2016-12-10 MED ORDER — GLYCOPYRROLATE 0.2 MG/ML IJ SOLN
INTRAMUSCULAR | Status: DC | PRN
  Administered 2016-12-10: 0.2 mg via INTRAVENOUS

## 2016-12-10 MED ORDER — PROMETHAZINE HCL 25 MG/ML IJ SOLN
INTRAMUSCULAR | Status: AC
  Administered 2016-12-10: 12.5 mg via INTRAVENOUS
  Filled 2016-12-10: qty 1

## 2016-12-10 MED ORDER — LACTATED RINGERS IV SOLN: INTRAVENOUS | Status: DC

## 2016-12-10 MED ORDER — HYDROCODONE-ACETAMINOPHEN 5-325 MG PO TABS
ORAL_TABLET | ORAL | Status: AC
  Administered 2016-12-10: 1 via ORAL
  Filled 2016-12-10: qty 1

## 2016-12-10 SURGICAL SUPPLY — 24 items
ABLATOR ENDOMETRIAL MYOSURE (ABLATOR) ×3 IMPLANT
BAG URO DRAIN 2000ML W/SPOUT (MISCELLANEOUS) IMPLANT
CANISTER SUC SOCK COL 7IN (MISCELLANEOUS) IMPLANT
CATH FOLEY 2WAY  5CC 16FR (CATHETERS)
CATH ROBINSON RED A/P 16FR (CATHETERS) ×3 IMPLANT
CATH URTH 16FR FL 2W BLN LF (CATHETERS) IMPLANT
DEVICE MYOSURE LITE (MISCELLANEOUS) IMPLANT
ELECT REM PT RETURN 9FT ADLT (ELECTROSURGICAL) ×3
ELECTRODE REM PT RTRN 9FT ADLT (ELECTROSURGICAL) ×1 IMPLANT
GLOVE BIO SURGEON STRL SZ7 (GLOVE) ×3 IMPLANT
GLOVE BIOGEL PI IND STRL 7.5 (GLOVE) ×1 IMPLANT
GLOVE BIOGEL PI INDICATOR 7.5 (GLOVE) ×2
GOWN STRL REUS W/ TWL LRG LVL3 (GOWN DISPOSABLE) ×1 IMPLANT
GOWN STRL REUS W/TWL LRG LVL3 (GOWN DISPOSABLE) ×2
IV LACTATED RINGER IRRG 3000ML (IV SOLUTION)
IV LR IRRIG 3000ML ARTHROMATIC (IV SOLUTION) IMPLANT
KIT RM TURNOVER CYSTO AR (KITS) ×3 IMPLANT
MYOSURE LITE POLYP REMOVAL (MISCELLANEOUS) ×3 IMPLANT
PACK DNC HYST (MISCELLANEOUS) ×3 IMPLANT
PAD OB MATERNITY 4.3X12.25 (PERSONAL CARE ITEMS) ×3 IMPLANT
PAD PREP 24X41 OB/GYN DISP (PERSONAL CARE ITEMS) ×3 IMPLANT
TUBING CONNECTING 10 (TUBING) ×2 IMPLANT
TUBING CONNECTING 10' (TUBING) ×1
TUBING HYSTEROSCOPY DOLPHIN (MISCELLANEOUS) IMPLANT

## 2016-12-10 NOTE — Telephone Encounter (Signed)
Spoke with pt and advised as below. Pt voiced understanding. [place] was canceled and OV was changed to Post Op visit. Thanks TNP

## 2016-12-10 NOTE — Anesthesia Procedure Notes (Signed)
Date/Time: 12/10/2016 8:52 AM Performed by: Stormy Fabian Pre-anesthesia Checklist: Patient identified, Emergency Drugs available, Suction available and Patient being monitored Patient Re-evaluated:Patient Re-evaluated prior to inductionOxygen Delivery Method: Nasal cannula Intubation Type: IV induction Dental Injury: Teeth and Oropharynx as per pre-operative assessment  Comments: Nasal cannula with etCO2 monitoring

## 2016-12-10 NOTE — H&P (View-Only) (Signed)
  Obstetrics & Gynecology Office Visit   Chief Complaint  Patient presents with  . Follow-up    History of Present Illness: 51 y.o. G3P3003 female who presents in follow up for pelvic pain and postmenopausal bleeding. She is scheduled to undergo a SIS in mid-May. She returns due to more vaginal bleeding over the weekend.  She has had a workup that includes a normal pap smear, a normal endometrial biopsy, and apparently normal ultrasound. She has been to the ER for this.  However, no etiology has been found.    Review of Systems: ROS Negative unless noted in the HPI.  Past Medical History:  Diagnosis Date  . Bipolar 1 disorder, depressed, severe (HCC)   . Degenerative disc disease, lumbar   . Hypertension   . Mitral valve disorder     Past Surgical History:  Procedure Laterality Date  . FRACTURE SURGERY    . TUBAL LIGATION      Gynecologic History: No LMP recorded. Patient is postmenopausal.  Obstetric History: G3P3003  Family History: reviewed  Social History   Social History  . Marital status: Legally Separated    Spouse name: N/A  . Number of children: N/A  . Years of education: N/A   Occupational History  . Not on file.   Social History Main Topics  . Smoking status: Current Every Day Smoker    Packs/day: 0.50    Types: Cigarettes  . Smokeless tobacco: Never Used  . Alcohol use No  . Drug use: No  . Sexual activity: Yes    Birth control/ protection: Post-menopausal   Other Topics Concern  . Not on file   Social History Narrative  . No narrative on file    Allergies  Allergen Reactions  . Penicillins Anaphylaxis    Has patient had a PCN reaction causing immediate rash, facial/tongue/throat swelling, SOB or lightheadedness with hypotension: Yes Has patient had a PCN reaction causing severe rash involving mucus membranes or skin necrosis: No Has patient had a PCN reaction that required hospitalization No Has patient had a PCN reaction occurring  within the last 10 years: No If all of the above answers are "NO", then may proceed with Cephalosporin use.   . Doxycycline Swelling  . Propoxyphene Other (See Comments)    darvocet  . Sulfa Antibiotics Swelling  . Toradol [Ketorolac Tromethamine]   . Tramadol Itching    Medications:   Medication Sig Start Date End Date Taking? Authorizing Provider  acetaminophen-codeine (TYLENOL #3) 300-30 MG tablet Take 1 tablet by mouth every 8 (eight) hours as needed (breakthrough pain). 12/08/16  Yes Amanii Snethen D Iria Jamerson, MD  albuterol (PROVENTIL HFA;VENTOLIN HFA) 108 (90 BASE) MCG/ACT inhaler Inhale 2 puffs into the lungs every 4 (four) hours as needed for wheezing or shortness of breath. 07/15/15  Yes Jonathan D Cuthriell, PA-C  diphenhydrAMINE (BENADRYL) 25 mg capsule Take 2 capsules (50 mg total) by mouth at bedtime as needed for sleep. 11/24/16  Yes Phillip Stafford, MD  FLUoxetine (PROZAC) 20 MG tablet Take 60 mg by mouth daily.   Yes Historical Provider, MD  gabapentin (NEURONTIN) 100 MG capsule Take 1 capsule (100 mg total) by mouth 3 (three) times daily. 02/13/16 02/12/17 Yes Rhonda L Summers, PA-C  ibuprofen (ADVIL,MOTRIN) 800 MG tablet Take 1 tablet (800 mg total) by mouth every 8 (eight) hours as needed for moderate pain. 09/17/16  Yes Jade J Sung, MD  lamoTRIgine (LAMICTAL) 150 MG tablet Take 150 mg by mouth 2 (two) times daily.     Yes Historical Provider, MD  meloxicam (MOBIC) 15 MG tablet Take 15 mg by mouth daily.    Yes Historical Provider, MD  naproxen (NAPROSYN) 500 MG tablet Take 1 tablet (500 mg total) by mouth 2 (two) times daily with a meal. 11/24/16  Yes Sharman Cheek, MD  ciprofloxacin (CIPRO) 500 MG tablet Take 1 tablet (500 mg total) by mouth 2 (two) times daily. Patient not taking: Reported on 11/29/2016 11/24/16   Sharman Cheek, MD    Physical Exam Vitals:  Vitals:   12/08/16 0858  BP: 124/78   No LMP recorded. Patient is postmenopausal.  General: NAD HEENT:  normocephalic, anicteric Pulmonary: No increased work of breathing Extremities: no edema, erythema, or tenderness Neurologic: Grossly intact Psychiatric: mood appropriate, affect full  Assessment: 51 y.o. [id number] with abdominal/pelvic pain and postmenopausal bleeding. Given her continued bleeding, I believe a direct visualization of the uterus is now in order. I am concerned for focal malignancy.  Recommend hysteroscoy, D&C as soon as it can be arranged.    Plan: Problem List Items Addressed This Visit    Postmenopausal bleeding - Primary   Suprapubic abdominal pain   Relevant Medications   acetaminophen-codeine (TYLENOL #3) 300-30 MG tablet     15 minutes spent in face to face discussion with > 50% spent in counseling and management of her abdominal/pelvic pain and postmenopausal bleeding.  [name], MD 12/08/2016 1:01 PM

## 2016-12-10 NOTE — Telephone Encounter (Signed)
[facility] called office to schedule pt's 3 week post op appt. Pt is already schedule for [place] and F/U on 12/28/16. I wasn't sure if pt still needs [place] and if we could do her post op appt on 12/28/16 that is already scheduled. I advised [facility] we would contact the pt to schedule the appt. Do we need to keep the scheduled appts and schedule a post op in addition or can the post op be done on 12/28/16? Please advise. Thanks TNP

## 2016-12-10 NOTE — Op Note (Signed)
Operative Note     PRE-OP DIAGNOSIS:  1) postmenopausal bleeding 2) suprapubic abdominal pain  POST-OP DIAGNOSIS:  1) postmenopausal bleeding 2) suprapubic abdominal pain  SURGEON: Will Bonnet, MD  PROCEDURE: DILATATION & CURETTAGE/HYSTEROSCOPY WITH POLYPECTOMY  ANESTHESIA: MAC  ESTIMATED BLOOD LOSS: 20 mL   IV Fluid: 500 mL crystalloid  SPECIMENS:  1) endometrial curettings 2) endometrial polyp  FLUID DEFICIT: min   COMPLICATIONS: none   DISPOSITION: PACU - hemodynamically stable.   CONDITION: stable   FINDINGS: Exam under anesthesia revealed small, mobile uterus with no masses and bilateral adnexa without masses or fullness. Hysteroscopy revealed polyp in right cornual region, otherwise grossly normal appearing uterine cavity with bilateral tubal ostia and normal appearing endocervical canal.   PROCEDURE IN DETAIL: After informed consent was obtained, the patient was taken to the operating room where monitored anesthesia was obtained without difficulty. The patient was positioned in the dorsal lithotomy position in candy cane stirrups. The patient's bladder was catheterized with an in and out foley catheter. The patient was examined under anesthesia, with the above noted findings. The bivalved speculum was placed inside the patient's vagina, and the the anterior lip of the cervix was seen and grasped with the tenaculum. The uterine cavity was sounded to 8 cm, and then the cervix was progressively dilated to a 6 mm using Hegar dilators. The MyoSure hysteroscope was introduced, with LR fluid used to distend the intrauterine cavity, with the above noted findings.   The hystersocope was removed and the uterine cavity was curetted until a gritty texture was noted, yielding endometrial curettings.  The hysteroscope was re-introduced and the polyp remained.  Given that the curetting did not remove the poly the MyoSure Light device was utilized.  The MyoSure was introduced  through the MyoSure hysteroscope.  The polyp was removed quite easily using this device.  Pressure was gradually reduced on the infusing fluid with hemostasis assured.  The hysteroscope was completed removed from the uterus and cervix.     Tenaculum was removed with excellent hemostasis noted without application of silver nitrate to the tenaculum entry sites. The vagina was verified to be free of instruments and sponges.  The speculum was removed.  She was then taken out of dorsal lithotomy. Hemostasis noted.  The patient tolerated the procedure well. Sponge, lap and needle counts were correct x2. The patient was taken to recovery room in excellent condition.  Prentice Docker, MD 12/10/2016 9:24 AM

## 2016-12-10 NOTE — Telephone Encounter (Signed)
Please see notes from Dr. Jean Rosenthal and update pt's appt. Thank you!

## 2016-12-10 NOTE — Anesthesia Preprocedure Evaluation (Signed)
Anesthesia Evaluation  Patient identified by MRN, date of birth, ID band Patient awake    Reviewed: Allergy & Precautions, H&P , NPO status , Patient's Chart, lab work & pertinent test results, reviewed documented beta blocker date and time   History of Anesthesia Complications Negative for: history of anesthetic complications  Airway Mallampati: III  TM Distance: >3 FB Neck ROM: full    Dental  (+) Partial Upper, Missing, Poor Dentition   Pulmonary shortness of breath and with exertion, neg sleep apnea, neg COPD, neg recent URI, Current Smoker,           Cardiovascular Exercise Tolerance: Good hypertension, (-) angina(-) CAD, (-) Past MI, (-) Cardiac Stents and (-) CABG (-) dysrhythmias + Valvular Problems/Murmurs      Neuro/Psych PSYCHIATRIC DISORDERS (Bipolar) negative neurological ROS     GI/Hepatic Neg liver ROS, GERD  ,  Endo/Other  negative endocrine ROS  Renal/GU Renal disease (kidney stones)  negative genitourinary   Musculoskeletal   Abdominal   Peds  Hematology negative hematology ROS (+)   Anesthesia Other Findings Past Medical History: No date: Anxiety No date: Bipolar 1 disorder, depressed, severe (HCC) No date: Bronchitis No date: DDD (degenerative disc disease), lumbar No date: Degenerative disc disease, lumbar No date: Dyspnea     Comment: with exertion No date: GERD (gastroesophageal reflux disease) No date: History of kidney stones No date: Hypertension No date: IBS (irritable bowel syndrome) No date: Mitral valve disorder   Reproductive/Obstetrics negative OB ROS                             Anesthesia Physical Anesthesia Plan  ASA: II  Anesthesia Plan: General   Post-op Pain Management:    Induction:   Airway Management Planned:   Additional Equipment:   Intra-op Plan:   Post-operative Plan:   Informed Consent: I have reviewed the patients History  and Physical, chart, labs and discussed the procedure including the risks, benefits and alternatives for the proposed anesthesia with the patient or authorized representative who has indicated his/her understanding and acceptance.   Dental Advisory Given  Plan Discussed with: Anesthesiologist, CRNA and Surgeon  Anesthesia Plan Comments:         Anesthesia Quick Evaluation

## 2016-12-10 NOTE — Interval H&P Note (Signed)
History and Physical Interval Note:  12/10/2016 8:12 AM  Robin Conway  has presented today for surgery, with the diagnosis of POST MENOPAUSAL BLEEDING,SUPRAPUBIC ABDOMINAL PAIN  The various methods of treatment have been discussed with the patient and family. After consideration of risks, benefits and other options for treatment, the patient has consented to  Procedure(s): DILATATION & CURETTAGE/HYSTEROSCOPY WITH MYOSURE (N/A) as a surgical intervention .  The patient's history has been reviewed, patient examined, no change in status, stable for surgery.  I have reviewed the patient's chart and labs.  Questions were answered to the patient's satisfaction.    Updated Exam: BP 140/84   Pulse 88   Temp 97.9 F (36.6 C) (Tympanic)   Resp 16   Ht  (1.626 m)   Wt 204 lb (92.5 kg)   LMP 11/12/2016   SpO2 99%   BMI 35.02 kg/m   Gen: NAD CV: RRR Pulm:CTAB Abd: S/nt/nd/nabs Ext; No e/c/t  [name], MD 12/10/2016 8:13 AM

## 2016-12-10 NOTE — Transfer of Care (Signed)
Immediate Anesthesia Transfer of Care Note  Patient: Robin Conway  Procedure(s) Performed: Procedure(s): DILATATION & CURETTAGE/HYSTEROSCOPY Jackquline [place] POLYPECTOMY (N/A)  Patient Location: PACU  Anesthesia Type:General  Level of Consciousness: sedated  Airway & Oxygen Therapy: Patient Spontanous Breathing and Patient connected to face mask oxygen  Post-op Assessment: Report given to RN and Post -op Vital signs reviewed and stable  Post vital signs: Reviewed and stable  Last Vitals:  Vitals:   12/10/16 0738 12/10/16 0934  BP: 140/84 130/77  Pulse: 88 66  Resp: 16 11  Temp: 36.6 C (!) 36 C    Complications: No apparent anesthesia complications

## 2016-12-10 NOTE — Telephone Encounter (Signed)
Patient may keep 5/15 appointment but for postop instead of ultrasound f/u.  We can cancel the ultrasound (SIS).

## 2016-12-10 NOTE — Anesthesia Postprocedure Evaluation (Signed)
Anesthesia Post Note  Patient: Robin Conway  Procedure(s) Performed: Procedure(s) (LRB): DILATATION & CURETTAGE/HYSTEROSCOPY /MYOSURE POLYPECTOMY (N/A)  Patient location during evaluation: PACU Anesthesia Type: General Level of consciousness: awake and alert Pain management: pain level controlled Vital Signs Assessment: post-procedure vital signs reviewed and stable Respiratory status: spontaneous breathing, nonlabored ventilation, respiratory function stable and patient connected to nasal cannula oxygen Cardiovascular status: blood pressure returned to baseline and stable Postop Assessment: no signs of nausea or vomiting Anesthetic complications: no     Last Vitals:  Vitals:   12/10/16 1041 12/10/16 1126  BP: 137/77 126/61  Pulse: 79 83  Resp: 20 20  Temp: 36.4 C     Last Pain:  Vitals:   12/10/16 1126  TempSrc:   PainSc: 6                  Lenard Simmer

## 2016-12-10 NOTE — Discharge Instructions (Addendum)
  AMBULATORY SURGERY  DISCHARGE INSTRUCTIONS   1) The drugs that you were given will stay in your system until tomorrow so for the next 24 hours you should not:  A) Drive an automobile B) Make any legal decisions C) Drink any alcoholic beverage   2) You may resume regular meals tomorrow.  Today it is better to start with liquids and gradually work up to solid foods.  You may eat anything you prefer, but it is better to start with liquids, then soup and crackers, and gradually work up to solid foods.   3) Please notify your doctor immediately if you have any unusual bleeding, trouble breathing, redness and pain at the surgery site, drainage, fever, or pain not relieved by medication.    4) Additional Instructions: TAKE A STOOL SOFTENER TWICE A DAY WHILE TAKING NARCOTIC PAIN MEDICINE TO PREVENT CONSTIPATION   Please contact your physician with any problems or Same Day Surgery at [phone], Monday through Friday 6 am to 4 pm, or [place] [facility] [place] Main number at [phone].   

## 2016-12-10 NOTE — Telephone Encounter (Signed)
Please advise 

## 2016-12-10 NOTE — Anesthesia Post-op Follow-up Note (Cosign Needed)
Anesthesia QCDR form completed.        

## 2016-12-13 ENCOUNTER — Emergency Department: Payer: Self-pay

## 2016-12-13 ENCOUNTER — Emergency Department
Admission: EM | Admit: 2016-12-13 | Discharge: 2016-12-13 | Disposition: A | Discharge status: Home / Self Care | Payer: Self-pay | Attending: Emergency Medicine | Admitting: Emergency Medicine

## 2016-12-13 ENCOUNTER — Encounter: Payer: Self-pay | Admitting: Obstetrics and Gynecology

## 2016-12-13 ENCOUNTER — Encounter: Payer: Self-pay | Admitting: Emergency Medicine

## 2016-12-13 ENCOUNTER — Ambulatory Visit (INDEPENDENT_AMBULATORY_CARE_PROVIDER_SITE_OTHER): Payer: Self-pay | Admitting: Obstetrics and Gynecology

## 2016-12-13 VITALS — BP 140/80 | HR 92 | Ht 64.0 in | Wt 204.0 lb

## 2016-12-13 DIAGNOSIS — Z79899 Other long term (current) drug therapy: Secondary | ICD-10-CM | POA: Insufficient documentation

## 2016-12-13 DIAGNOSIS — G8918 Other acute postprocedural pain: Secondary | ICD-10-CM | POA: Insufficient documentation

## 2016-12-13 DIAGNOSIS — F17210 Nicotine dependence, cigarettes, uncomplicated: Secondary | ICD-10-CM | POA: Insufficient documentation

## 2016-12-13 DIAGNOSIS — R102 Pelvic and perineal pain: Secondary | ICD-10-CM

## 2016-12-13 DIAGNOSIS — R1030 Lower abdominal pain, unspecified: Secondary | ICD-10-CM | POA: Insufficient documentation

## 2016-12-13 DIAGNOSIS — I10 Essential (primary) hypertension: Secondary | ICD-10-CM | POA: Insufficient documentation

## 2016-12-13 LAB — CBC WITH DIFFERENTIAL/PLATELET
Basophils Absolute: 0.1 10*3/uL (ref 0–0.1)
Basophils Relative: 1 %
EOS ABS: 0.3 10*3/uL (ref 0–0.7)
Eosinophils Relative: 3 %
HEMATOCRIT: 42.9 % (ref 35.0–47.0)
HEMOGLOBIN: 14.5 g/dL (ref 12.0–16.0)
LYMPHS PCT: 32 %
Lymphs Abs: 3.4 10*3/uL (ref 1.0–3.6)
MCH: 28.4 pg (ref 26.0–34.0)
MCHC: 33.9 g/dL (ref 32.0–36.0)
MCV: 83.8 fL (ref 80.0–100.0)
Monocytes Absolute: 0.9 10*3/uL (ref 0.2–0.9)
Monocytes Relative: 9 %
Neutro Abs: 6 10*3/uL (ref 1.4–6.5)
Neutrophils Relative %: 55 %
Platelets: 260 10*3/uL (ref 150–440)
RBC: 5.12 MIL/uL (ref 3.80–5.20)
RDW: 15.1 % — ABNORMAL HIGH (ref 11.5–14.5)
WBC: 10.7 10*3/uL (ref 3.6–11.0)

## 2016-12-13 LAB — COMPREHENSIVE METABOLIC PANEL
ALBUMIN: 4.1 g/dL (ref 3.5–5.0)
ALK PHOS: 93 U/L (ref 38–126)
ALT: 58 U/L — AB (ref 14–54)
AST: 66 U/L — ABNORMAL HIGH (ref 15–41)
Anion gap: 6 (ref 5–15)
BUN: 9 mg/dL (ref 6–20)
CALCIUM: 9 mg/dL (ref 8.9–10.3)
CHLORIDE: 104 mmol/L (ref 101–111)
CO2: 29 mmol/L (ref 22–32)
CREATININE: 0.78 mg/dL (ref 0.44–1.00)
GFR calc non Af Amer: >60 mL/min (ref >60)
GLUCOSE: 97 mg/dL (ref 65–99)
Potassium: 4 mmol/L (ref 3.5–5.1)
SODIUM: 139 mmol/L (ref 135–145)
Total Bilirubin: 0.6 mg/dL (ref 0.3–1.2)
Total Protein: 7.6 g/dL (ref 6.5–8.1)

## 2016-12-13 LAB — URINE DRUG SCREEN, QUALITATIVE (ARMC ONLY)
Amphetamines, Ur Screen: NOT DETECTED
BARBITURATES, UR SCREEN: NOT DETECTED
BENZODIAZEPINE, UR SCRN: POSITIVE — AB
COCAINE METABOLITE, UR ~~LOC~~: NOT DETECTED
Cannabinoid 50 Ng, Ur ~~LOC~~: NOT DETECTED
MDMA (Ecstasy)Ur Screen: NOT DETECTED
METHADONE SCREEN, URINE: NOT DETECTED
OPIATE, UR SCREEN: POSITIVE — AB
PHENCYCLIDINE (PCP) UR S: NOT DETECTED
Tricyclic, Ur Screen: NOT DETECTED

## 2016-12-13 LAB — URINALYSIS, COMPLETE (UACMP) WITH MICROSCOPIC
BILIRUBIN URINE: NEGATIVE
Glucose, UA: NEGATIVE mg/dL
Hgb urine dipstick: NEGATIVE
KETONES UR: NEGATIVE mg/dL
LEUKOCYTES UA: NEGATIVE
Nitrite: POSITIVE — AB
Protein, ur: NEGATIVE mg/dL
SPECIFIC GRAVITY, URINE: 1.014 (ref 1.005–1.030)
pH: 6 (ref 5.0–8.0)

## 2016-12-13 LAB — SURGICAL PATHOLOGY

## 2016-12-13 MED ORDER — ONDANSETRON HCL 4 MG/2ML IJ SOLN
INTRAMUSCULAR | Status: AC
  Filled 2016-12-13: qty 2

## 2016-12-13 MED ORDER — IOPAMIDOL (ISOVUE-300) INJECTION 61%
30.0000 mL | Freq: Once | INTRAVENOUS | Status: AC | PRN
Start: 2016-12-13 — End: 2016-12-13
  Administered 2016-12-13: 30 mL via ORAL

## 2016-12-13 MED ORDER — IOPAMIDOL (ISOVUE-300) INJECTION 61%
100.0000 mL | Freq: Once | INTRAVENOUS | Status: AC | PRN
  Administered 2016-12-13: 100 mL via INTRAVENOUS

## 2016-12-13 MED ORDER — ONDANSETRON HCL 4 MG/2ML IJ SOLN
4.0000 mg | INTRAMUSCULAR | Status: AC
  Administered 2016-12-13: 4 mg via INTRAVENOUS

## 2016-12-13 MED ORDER — OXYCODONE-ACETAMINOPHEN 5-325 MG PO TABS
1.0000 | ORAL_TABLET | Freq: Once | ORAL | Status: AC
  Administered 2016-12-13: 1 via ORAL
  Filled 2016-12-13: qty 1

## 2016-12-13 MED ORDER — OXYCODONE-ACETAMINOPHEN 5-325 MG PO TABS: 1.0000 | ORAL_TABLET | Freq: Four times a day (QID) | ORAL | 0 refills | Status: DC | PRN

## 2016-12-13 NOTE — ED Triage Notes (Signed)
Pt presents with vaginal pain from procedure on last Friday. Pt states had polyp removed from her cervix and has had increased pain since. Pt was sent over per GYN.

## 2016-12-13 NOTE — Progress Notes (Signed)
   Postoperative Follow-up Patient presents post op from a hysteroscopy, dilation and curettage 3days ago for abdomino-pelvic pain and postmenopausal bleeding.  Subjective: She reports today that she had a fall at home on Friday and again on Saturday. She states that she has not felt light-headed or dizzy. She is unable to describe why she fell.  She has pain in her left side and left hip from falling and states that ibuprofen doesn't help. Denies fevers, but does report chills. Has had a BM and flatus. Vaginal bleeding lasted from Friday until yesterday, though not heavy. No nausea or vomiting.  Objective: Vitals:   12/13/16 1035  BP: 140/80  Pulse: 92   Vital Signs: BP 140/80   Pulse 92   Ht  (1.626 m)   Wt 204 lb (92.5 kg)   BMI 35.02 kg/m  Constitutional: Well nourished, well developed female in no mild-moderate distress.  HEENT: normal Skin: Warm and dry.  Extremity: no edema  Abdomen: soft, tender to palpation throughout with active guarding, ?rebound (hard to assess with her guarding), +BS She is tender throughout her abdomen, though it is soft. CV: RRR pulm: CTAB   Assessment: 51 y.o. s/p hysteroscopy, dilation and curettage. She has pain that is well beyond expected.  Her pain is significantly out of proportion to that expected after her type of surgery.  Plan: Given her level of pain and the fact that she had two unexplained falls over the weekend, I very strongly recommended that she report to the ER immediately. She stated that she did not think the person who gave her a ride would take her.  I then stated that I would call and ambulance for her so that she would go directly to the ER and nowhere else.  She called her ride and her ride stated that he would take her directly to the ER. I discussed my concerns and that I was worried about her and what her exam indicated.  I told her that she needed and emergent evaluation in the ER. Given her falls and her pain, I can  not say definitively that they are related to her surgery (her surgery was quite uncomplicated).  Still there are unseen issues that can come up.  She voiced an understanding of the very serious nature (potential death) of my concerns.  She states that she will go directly to the ER. She refused an ambulance.   [name], MD 12/13/2016, 10:50 AM

## 2016-12-13 NOTE — Discharge Instructions (Signed)
We cannot give you long-term pain management solutions out of the emergency department for this recurrent chronic pain. We will give you pain medications to get you  through to you can see her doctor. Return to the emergency room for any new or worrisome symptoms including increased pain, vomiting, fever, bleeding from your  vagina, or you feel worse in any way. Follow-up also with her primary care doctor in the next few days.

## 2016-12-13 NOTE — ED Provider Notes (Addendum)
[facility] Emergency Department Provider Note  ____________________________________________   I have reviewed the triage vital signs and the nursing notes.   HISTORY  Chief Complaint Post-op Problem    HPI Robin Conway is a 51 y.o. female who presents today complaining of suprapubic pain. Patient has had a history of chronic pain from well over a year apparently. Has had extensive workup for this.Saw her OB/GYN Dr. Jean Rosenthal who elected to do a stress Capri with a resection of a small polyp. This was done last week, patient has had increasing pain despite narcotic pain medication. Patient states that she has been taking her : Hydrocodone as prescribed however she ran out and the pain is getting worse. She denies vaginal bleeding. She's had some nausea but no vomiting. She denies fever or chills. The suprapubic pain. This is the same pain she has had for years but feels more intense after the procedure. She saw Dr. Jean Rosenthal he states that he sent her in here for a scan. He would refer not to give the patient a large number of narcotic pain medications in the emergency department given the chronic nature of her pain and her extensive narcotic use history. The pain has been there for a year worse over the last couple days as sharp and nonradiating nothing makes it better nothing makes it worse, no other aggravating or alleviating factors or associated factors. She's tried taking undercurrent at home with minimal relief. Patient and a drug suggested she had Tylenol 3 on April 16, Tylenol 3 on March 25, hydrocodone acetaminophen on March 27, she is chronically on clonazepam, she is also on Lyrica for pain, and her last narcotics besides this was tramadol in December and more oxycodone in February which time she was also worked up for this pelvic pain.      Past Medical History:  Diagnosis Date  . Anxiety   . Bipolar 1 disorder, depressed, severe (HCC)   . Bronchitis   .  DDD (degenerative disc disease), lumbar   . Degenerative disc disease, lumbar   . Dyspnea    with exertion  . GERD (gastroesophageal reflux disease)   . History of kidney stones   . Hypertension   . IBS (irritable bowel syndrome)   . Mitral valve disorder     Patient Active Problem List   Diagnosis Date Noted  . UTI (urinary tract infection) 11/17/2016  . Postmenopausal bleeding 11/09/2016  . Suprapubic abdominal pain 11/09/2016  . Fall 07/30/2016  . Acute midline low back pain without sciatica 07/30/2016    Past Surgical History:  Procedure Laterality Date  . APPENDECTOMY    . DILATATION & CURETTAGE/HYSTEROSCOPY WITH MYOSURE N/A 12/10/2016   Procedure: DILATATION & CURETTAGE/HYSTEROSCOPY Jackquline Denmark POLYPECTOMY;  Surgeon: Conard Novak, MD;  Location: [facility] ORS;  Service: Gynecology;  Laterality: N/A;  . FRACTURE SURGERY Left    Arm  . TUBAL LIGATION      Prior to Admission medications   Medication Sig Start Date End Date Taking? Authorizing Provider  amLODipine (NORVASC) 10 MG tablet Take 10 mg by mouth daily.   Yes Historical Provider, MD  clonazePAM (KLONOPIN) 0.5 MG tablet Take 0.5 mg by mouth at bedtime.   Yes Historical Provider, MD  diphenhydrAMINE (BENADRYL) 25 mg capsule Take 2 capsules (50 mg total) by mouth at bedtime as needed for sleep. 11/24/16  Yes Sharman Cheek, MD  FLUoxetine (PROZAC) 20 MG tablet Take 60 mg by mouth daily.   Yes Historical Provider, MD  HYDROcodone-acetaminophen (NORCO) 5-325 MG tablet Take 1 tablet by mouth every 6 (six) hours as needed (breakthrough pain). 12/10/16  Yes Conard Novak, MD  lamoTRIgine (LAMICTAL) 150 MG tablet Take 150 mg by mouth 2 (two) times daily.   Yes Historical Provider, MD  meloxicam (MOBIC) 15 MG tablet Take 15 mg by mouth as needed.    Yes Historical Provider, MD  omeprazole (PRILOSEC) 20 MG capsule Take 20 mg by mouth daily.   Yes Historical Provider, MD    Allergies Penicillins; Doxycycline; Propoxyphene;  Sulfa antibiotics; Toradol [ketorolac tromethamine]; and Tramadol  Family History  Problem Relation Age of Onset  . Diabetes Mother   . Hypertension Mother   . Kidney failure Mother   . Pancreatic cancer Father     Social History Social History  Substance Use Topics  . Smoking status: Current Every Day Smoker    Packs/day: 0.25    Types: Cigarettes  . Smokeless tobacco: Never Used  . Alcohol use No    Review of Systems Constitutional: No fever/chills Eyes: No visual changes. ENT: No sore throat. No stiff neck no neck pain Cardiovascular: Denies chest pain. Respiratory: Denies shortness of breath. Gastrointestinal:   no vomiting.  No diarrhea.  No constipation. Genitourinary: Negative for dysuria. Musculoskeletal: Negative lower extremity swelling Skin: Negative for rash. Neurological: Negative for severe headaches, focal weakness or numbness. 10-point ROS otherwise negative.  ____________________________________________   PHYSICAL EXAM:  VITAL SIGNS: ED Triage Vitals  Enc Vitals Group     BP 12/13/16 1125 125/73     Pulse Rate 12/13/16 1125 (!) 105     Resp 12/13/16 1125 20     Temp 12/13/16 1125 98 F (36.7 C)     Temp Source 12/13/16 1125 Oral     SpO2 12/13/16 1125 97 %     Weight 12/13/16 1125 204 lb (92.5 kg)     Height --      Head Circumference --      Peak Flow --      Pain Score 12/13/16 1124 5     Pain Loc --      Pain Edu? --      Excl. in GC? --     Constitutional: Alert and oriented. Well appearing and in no acute distress. Eyes: Conjunctivae are normal. PERRL. EOMI. Head: Atraumatic. Nose: No congestion/rhinnorhea. Mouth/Throat: Mucous membranes are moist.  Oropharynx non-erythematous. Neck: No stridor.   Nontender with no meningismus Cardiovascular: Normal rate, regular rhythm. Grossly normal heart sounds.  Good peripheral circulation. Respiratory: Normal respiratory effort.  No retractions. Lungs CTAB. Abdominal: Soft and suprapubic  tenderness, distractible exam,. No distention. No guarding no rebound Back:  There is no focal tenderness or step off.  there is no midline tenderness there are no lesions noted. there is no CVA tenderness GU, deferred at the request of OB Musculoskeletal: No lower extremity tenderness, no upper extremity tenderness. No joint effusions, no DVT signs strong distal pulses no edema Neurologic:  Normal speech and language. No gross focal neurologic deficits are appreciated.  Skin:  Skin is warm, dry and intact. No rash noted. Psychiatric: Mood and affect are normal. Speech and behavior are normal.  ____________________________________________   LABS (all labs ordered are listed, but only abnormal results are displayed)  Labs Reviewed  CBC WITH DIFFERENTIAL/PLATELET - Abnormal; Notable for the following:       Result Value   RDW 15.1 (*)    All other components within normal limits  COMPREHENSIVE METABOLIC PANEL -  Abnormal; Notable for the following:    AST 66 (*)    ALT 58 (*)    All other components within normal limits  URINE DRUG SCREEN, QUALITATIVE ([facility] ONLY) - Abnormal; Notable for the following:    Opiate, Ur Screen POSITIVE (*)    Benzodiazepine, Ur Scrn POSITIVE (*)    All other components within normal limits  URINALYSIS, COMPLETE (UACMP) WITH MICROSCOPIC - Abnormal; Notable for the following:    Color, Urine YELLOW (*)    APPearance HAZY (*)    Nitrite POSITIVE (*)    Bacteria, UA MANY (*)    Squamous Epithelial / LPF 0-5 (*)    All other components within normal limits  POC OCCULT BLOOD, ED   ____________________________________________  EKG  I personally interpreted any EKGs ordered by me or triage  ____________________________________________  RADIOLOGY  I reviewed any imaging ordered by me or triage that were performed during my shift and, if possible, patient and/or family made aware of any abnormal  findings. ____________________________________________   PROCEDURES  Procedure(s) performed: None  Procedures  Critical Care performed: None  ____________________________________________   INITIAL IMPRESSION / ASSESSMENT AND PLAN / ED COURSE  Pertinent labs & imaging results that were available during my care of the patient were reviewed by me and considered in my medical decision making (see chart for details).  Patient with chronic recurrent suprapubic pelvic pain presents today complaining of ongoing pain after a minimal procedure. I did discuss with Dr. Jean Rosenthal. He states he does not feel that a pelvic exam would be of utility, if it needs to be done he will do it he states. He states that he already saw her today. He sent her into the emergency room for blood work and imaging. He would like a CT scan. Patient has asked me multiple times for more narcotic pain medication. I have given her oral narcotics here. I'm reluctant to give her IV narcotics for this chronic pain given the hospital chronic pain policy, but we will do so if needed. Patient's urine is nitrite positive but no other significant evidence of urinary tract infection, we will send urine culture may be empirically treat her. CBC is reassuring despite pain for several days white count is reassuring, CMP is reassuring. ----------------------------------------- 5:33 PM on 12/13/2016 -----------------------------------------  Seal exams are reassuring Dr. Jackquline Denmark examined her today does not feel pelvic exam or ultrasound is indicated, CT is reassuring, discussed with Dr. Jean Rosenthal reviewed all the findings he feels patient is safe for discharge. She will see him in the office. I will send her with a few days of pain medication to get her until she can see him.   ____________________________________________   FINAL CLINICAL IMPRESSION(S) / ED DIAGNOSES  Final diagnoses:  None      This chart was dictated using  voice recognition software.  Despite best efforts to proofread,  errors can occur which can change meaning.      Jeanmarie Plant, MD 12/13/16 1610    Jeanmarie Plant, MD 12/13/16 [phone]

## 2016-12-15 LAB — URINE CULTURE

## 2016-12-28 ENCOUNTER — Ambulatory Visit: Payer: Self-pay | Admitting: Obstetrics and Gynecology

## 2016-12-28 ENCOUNTER — Other Ambulatory Visit: Payer: Self-pay

## 2017-01-06 ENCOUNTER — Ambulatory Visit (INDEPENDENT_AMBULATORY_CARE_PROVIDER_SITE_OTHER): Payer: Self-pay | Admitting: Obstetrics and Gynecology

## 2017-01-06 ENCOUNTER — Encounter: Payer: Self-pay | Admitting: Obstetrics and Gynecology

## 2017-01-06 VITALS — BP 126/78 | Ht 64.0 in | Wt 208.0 lb

## 2017-01-06 DIAGNOSIS — N950 Postmenopausal bleeding: Secondary | ICD-10-CM

## 2017-01-06 DIAGNOSIS — R102 Pelvic and perineal pain: Secondary | ICD-10-CM

## 2017-01-06 MED ORDER — ACETAMINOPHEN-CODEINE #3 300-30 MG PO TABS: 1.0000 | ORAL_TABLET | Freq: Every day | ORAL | 0 refills | Status: AC | PRN

## 2017-01-06 NOTE — Progress Notes (Signed)
   Postoperative Follow-up Patient presents post op from hysteroscopy, dilation and curettage, polypectomy 1months ago for postmenopausal bleeding, suprapubic pain.  Subjective: Patient reports marked improvement in her preop symptoms. Eating a regular diet without difficulty. Pain is controlled without any medications.  Activity: normal activities of daily living.  Objective: Vitals:   01/06/17 0858  BP: 126/78   Vital Signs: BP 126/78   Ht 5\' 4"  (1.626 m)   Wt 208 lb (94.3 kg)   BMI 35.70 kg/m  Constitutional: Well nourished, well developed female in no acute distress.  HEENT: normal Skin: Warm and dry.  Extremity: no edema  Abdomen: Soft, non-tender, normal bowel sounds; no bruits, organomegaly or masses. n/a  Pelvic exam: deferred    Assessment: 51 y.o. s/p hysteroscopy, dilation and curettage, polypectomy progressing well  Plan: Patient has done well after surgery with no apparent complications.  I have discussed the post-operative course to date, and the expected progress moving forward.  The patient understands what complications to be concerned about.  I will see the patient in routine follow up, or sooner if needed.    Activity plan: No restriction.  FSH today to assess true menopausal status.  Thomasene MohairStephen Eben Choinski, MD 01/06/2017, 9:17 AM

## 2017-02-01 ENCOUNTER — Emergency Department
Admission: EM | Admit: 2017-02-01 | Discharge: 2017-02-01 | Disposition: A | Discharge status: Home / Self Care | Payer: Self-pay | Attending: Emergency Medicine | Admitting: Emergency Medicine

## 2017-02-01 DIAGNOSIS — W57XXXA Bitten or stung by nonvenomous insect and other nonvenomous arthropods, initial encounter: Secondary | ICD-10-CM | POA: Insufficient documentation

## 2017-02-01 DIAGNOSIS — Y929 Unspecified place or not applicable: Secondary | ICD-10-CM | POA: Insufficient documentation

## 2017-02-01 DIAGNOSIS — Y939 Activity, unspecified: Secondary | ICD-10-CM | POA: Insufficient documentation

## 2017-02-01 DIAGNOSIS — Y999 Unspecified external cause status: Secondary | ICD-10-CM | POA: Insufficient documentation

## 2017-02-01 DIAGNOSIS — L089 Local infection of the skin and subcutaneous tissue, unspecified: Secondary | ICD-10-CM | POA: Insufficient documentation

## 2017-02-01 MED ORDER — OXYCODONE-ACETAMINOPHEN 5-325 MG PO TABS: 1.0000 | ORAL_TABLET | Freq: Four times a day (QID) | ORAL | 0 refills | Status: AC | PRN

## 2017-02-01 MED ORDER — CLINDAMYCIN HCL 150 MG PO CAPS: 150.0000 mg | ORAL_CAPSULE | Freq: Four times a day (QID) | ORAL | 0 refills | Status: DC

## 2017-02-01 NOTE — ED Provider Notes (Signed)
[facility] Emergency Department Provider Note   ____________________________________________   First MD Initiated Contact with Patient 02/01/17 1408     (approximate)  I have reviewed the triage vital signs and the nursing notes.   HISTORY  Chief Complaint Insect Bite    HPI Robin Conway is a 51 y.o. female patient complaining of pain, redness, and swelling posterior elbow secondary to insect bite. Patient state the bite occurred 3 or 4 days ago.Patient denies drainage from the site. Patient denies any fever. Patient denies nausea or vomiting. Patient rates the pain as a 6/10. Patient described a pain as "achy". No palliative measures for complaint.  Past Medical History:  Diagnosis Date  . Anxiety   . Bipolar 1 disorder, depressed, severe (HCC)   . Bronchitis   . DDD (degenerative disc disease), lumbar   . Degenerative disc disease, lumbar   . Dyspnea    with exertion  . GERD (gastroesophageal reflux disease)   . History of kidney stones   . Hypertension   . IBS (irritable bowel syndrome)   . Mitral valve disorder     Patient Active Problem List   Diagnosis Date Noted  . UTI (urinary tract infection) 11/17/2016  . Postmenopausal bleeding 11/09/2016  . Suprapubic abdominal pain 11/09/2016  . Fall 07/30/2016  . Acute midline low back pain without sciatica 07/30/2016    Past Surgical History:  Procedure Laterality Date  . APPENDECTOMY    . DILATATION & CURETTAGE/HYSTEROSCOPY WITH MYOSURE N/A 12/10/2016   Procedure: DILATATION & CURETTAGE/HYSTEROSCOPY Jackquline Denmark/MYOSURE POLYPECTOMY;  Surgeon: Conard NovakStephen D Jackson, MD;  Location: [facility] ORS;  Service: Gynecology;  Laterality: N/A;  . FRACTURE SURGERY Left    Arm  . HYSTEROSCOPY    . TUBAL LIGATION      Prior to Admission medications   Medication Sig Start Date End Date Taking? Authorizing Provider  acetaminophen-codeine (TYLENOL #3) 300-30 MG tablet Take 1 tablet by mouth daily as needed for  moderate pain. 01/06/17   Conard NovakJackson, Stephen D, MD  amLODipine (NORVASC) 10 MG tablet Take 10 mg by mouth daily.    [provider]  clindamycin (CLEOCIN) 150 MG capsule Take 1 capsule (150 mg total) by mouth 4 (four) times daily. 02/01/17   Joni ReiningSmith, Ronald K, PA-C  clonazePAM (KLONOPIN) 0.5 MG tablet Take 0.5 mg by mouth at bedtime.    [provider]  diphenhydrAMINE (BENADRYL) 25 mg capsule Take 2 capsules (50 mg total) by mouth at bedtime as needed for sleep. 11/24/16   Sharman CheekStafford, Phillip, MD  FLUoxetine (PROZAC) 20 MG tablet Take 60 mg by mouth daily.    [provider]  lamoTRIgine (LAMICTAL) 150 MG tablet Take 150 mg by mouth 2 (two) times daily.    [provider]  meloxicam (MOBIC) 15 MG tablet Take 15 mg by mouth as needed.     [provider]  omeprazole (PRILOSEC) 20 MG capsule Take 20 mg by mouth daily.    [provider]  oxyCODONE-acetaminophen (ROXICET) 5-325 MG tablet Take 1 tablet by mouth every 6 (six) hours as needed for moderate pain. 02/01/17   Joni ReiningSmith, Ronald K, PA-C    Allergies Penicillins; Doxycycline; Propoxyphene; Sulfa antibiotics; Toradol [ketorolac tromethamine]; and Tramadol  Family History  Problem Relation Age of Onset  . Diabetes Mother   . Hypertension Mother   . Kidney failure Mother   . Pancreatic cancer Father     Social History Social History  Substance Use Topics  . Smoking status:  Current Every Day Smoker    Packs/day: 0.25    Types: Cigarettes  . Smokeless tobacco: Never Used  . Alcohol use No    Review of Systems  Constitutional: No fever/chills Eyes: No visual changes. ENT: No sore throat. Cardiovascular: Denies chest pain. Respiratory: Denies shortness of breath. Gastrointestinal: No abdominal pain.  No nausea, no vomiting.  No diarrhea.  No constipation. Genitourinary: Negative for dysuria. Musculoskeletal: Negative for back pain. Skin: Negative for rash. Neurological: Negative for  headaches, focal weakness or numbness. Psychiatric:Anxiety and depression. Endocrine:Hypertension Allergic/Immunilogical: See medication list ____________________________________________   PHYSICAL EXAM:  VITAL SIGNS: ED Triage Vitals  Enc Vitals Group     BP 02/01/17 1354 (!) 153/78     Pulse Rate 02/01/17 1354 94     Resp 02/01/17 1354 17     Temp 02/01/17 1354 98.1 F (36.7 C)     Temp Source 02/01/17 1354 Oral     SpO2 02/01/17 1354 95 %     Weight 02/01/17 1355 211 lb (95.7 kg)     Height 02/01/17 1355 5\' 4"  (1.626 m)     Head Circumference --      Peak Flow --      Pain Score 02/01/17 1354 6     Pain Loc --      Pain Edu? --      Excl. in GC? --     Constitutional: Alert and oriented. Well appearing and in no acute distress. Cardiovascular: Normal rate, regular rhythm. Grossly normal heart sounds.  Good peripheral circulation. Respiratory: Normal respiratory effort.  No retractions. Lungs CTAB. Gastrointestinal: Soft and nontender. No distention. No abdominal bruits. No CVA tenderness. Musculoskeletal: No lower extremity tenderness nor edema.  No joint effusions. Neurologic:  Normal speech and language. No gross focal neurologic deficits are appreciated. No gait instability. Skin:  Skin is warm, dry and intact. No rash noted. Papular lesion on erythematous base posterior right elbow. Moderate guarding to palpation. No active drainage. Lesion is nonfluctuant. Psychiatric: Mood and affect are normal. Speech and behavior are normal.  ____________________________________________   LABS (all labs ordered are listed, but only abnormal results are displayed)  Labs Reviewed - No data to display ____________________________________________  EKG   ____________________________________________  RADIOLOGY  No results found.  ____________________________________________   PROCEDURES  Procedure(s) performed: None  Procedures  Critical Care performed:  No  ____________________________________________   INITIAL IMPRESSION / ASSESSMENT AND PLAN / ED COURSE  Pertinent labs & imaging results that were available during my care of the patient were reviewed by me and considered in my medical decision making (see chart for details).  Infected insect bite right elbow. Patient given discharge care instructions. Patient advised to [facility] condition [facility].      ____________________________________________   FINAL CLINICAL IMPRESSION(S) / ED DIAGNOSES  Final diagnoses:  Bug bite with infection, initial encounter      NEW MEDICATIONS STARTED DURING THIS VISIT:  New Prescriptions   CLINDAMYCIN (CLEOCIN) 150 MG CAPSULE    Take 1 capsule (150 mg total) by mouth 4 (four) times daily.   OXYCODONE-ACETAMINOPHEN (ROXICET) 5-325 MG TABLET    Take 1 tablet by mouth every 6 (six) hours as needed for moderate pain.     Note:  This document was prepared using Dragon voice recognition software and may include unintentional dictation errors.    Joni Reining, PA-C 02/01/17 1420    Rockne Menghini, MD 02/01/17 1538

## 2017-02-01 NOTE — ED Triage Notes (Signed)
Pt c/o having a bug bite to the right elbow with pain and swelling.

## 2017-02-01 NOTE — ED Notes (Signed)
Patient presents to the ED with small red raised area to her right elbow.  Patient is in no obvious distress at this time.  States she noticed area today and she "mashed on it" and now it is hurting worse.  Patient is in no obvious distress at this time.

## 2017-02-01 NOTE — Discharge Instructions (Signed)
Apply warm compresses to the area 3 times a day. Approximate 5 minutes. Did not try to express any material from insect bite site.

## 2017-02-05 ENCOUNTER — Encounter: Payer: Self-pay | Admitting: Emergency Medicine

## 2017-02-05 ENCOUNTER — Emergency Department
Admission: EM | Admit: 2017-02-05 | Discharge: 2017-02-05 | Disposition: A | Discharge status: Home / Self Care | Payer: Self-pay | Attending: Emergency Medicine | Admitting: Emergency Medicine

## 2017-02-05 DIAGNOSIS — F17210 Nicotine dependence, cigarettes, uncomplicated: Secondary | ICD-10-CM | POA: Insufficient documentation

## 2017-02-05 DIAGNOSIS — Z79899 Other long term (current) drug therapy: Secondary | ICD-10-CM | POA: Insufficient documentation

## 2017-02-05 DIAGNOSIS — M25521 Pain in right elbow: Secondary | ICD-10-CM | POA: Insufficient documentation

## 2017-02-05 DIAGNOSIS — W57XXXD Bitten or stung by nonvenomous insect and other nonvenomous arthropods, subsequent encounter: Secondary | ICD-10-CM | POA: Insufficient documentation

## 2017-02-05 DIAGNOSIS — I10 Essential (primary) hypertension: Secondary | ICD-10-CM | POA: Insufficient documentation

## 2017-02-05 MED ORDER — IBUPROFEN 600 MG PO TABS: 600.0000 mg | ORAL_TABLET | Freq: Once | ORAL | Status: DC

## 2017-02-05 MED ORDER — OXYCODONE-ACETAMINOPHEN 5-325 MG PO TABS: 1.0000 | ORAL_TABLET | Freq: Four times a day (QID) | ORAL | 0 refills | Status: AC | PRN

## 2017-02-05 MED ORDER — OXYCODONE-ACETAMINOPHEN 5-325 MG PO TABS: 1.0000 | ORAL_TABLET | Freq: Once | ORAL | Status: DC

## 2017-02-05 MED ORDER — OXYCODONE-ACETAMINOPHEN 5-325 MG PO TABS
1.0000 | ORAL_TABLET | Freq: Once | ORAL | Status: AC
  Administered 2017-02-05: 1 via ORAL
  Filled 2017-02-05: qty 1

## 2017-02-05 NOTE — ED Provider Notes (Signed)
[facility] Emergency Department Provider Note   ____________________________________________   First MD Initiated Contact with Patient 02/05/17 1449     (approximate)  I have reviewed the triage vital signs and the nursing notes.   HISTORY  Chief Complaint Insect Bite    HPI Robin Conway is a 51 y.o. female patient complaining of pain secondary to insect bite to the right elbow which occurred with ago. Patient states this facility 3 days ago for some antibiotics and anti-inflammatory medications patient states she has apply warm compresses area and noticed was leaking so she decides expressible purulent material from the insect bite. Patient state after she ruptured the lesion the pain increase. Patient denies loss of sensation or loss of function of the right upper extremity. Patient rates the pain at 6/10. Patient described a pain as "achy". Patient is right-hand dominant.   Past Medical History:  Diagnosis Date  . Anxiety   . Bipolar 1 disorder, depressed, severe (HCC)   . Bronchitis   . DDD (degenerative disc disease), lumbar   . Degenerative disc disease, lumbar   . Dyspnea    with exertion  . GERD (gastroesophageal reflux disease)   . History of kidney stones   . Hypertension   . IBS (irritable bowel syndrome)   . Mitral valve disorder     Patient Active Problem List   Diagnosis Date Noted  . UTI (urinary tract infection) 11/17/2016  . Postmenopausal bleeding 11/09/2016  . Suprapubic abdominal pain 11/09/2016  . Fall 07/30/2016  . Acute midline low back pain without sciatica 07/30/2016    Past Surgical History:  Procedure Laterality Date  . APPENDECTOMY    . DILATATION & CURETTAGE/HYSTEROSCOPY WITH MYOSURE N/A 12/10/2016   Procedure: DILATATION & CURETTAGE/HYSTEROSCOPY Jackquline Denmark POLYPECTOMY;  Surgeon: Conard Novak, MD;  Location: [facility] ORS;  Service: Gynecology;  Laterality: N/A;  . FRACTURE SURGERY Left    Arm  .  HYSTEROSCOPY    . TUBAL LIGATION      Prior to Admission medications   Medication Sig Start Date End Date Taking? Authorizing Provider  acetaminophen-codeine (TYLENOL #3) 300-30 MG tablet Take 1 tablet by mouth daily as needed for moderate pain. 01/06/17   Conard Novak, MD  amLODipine (NORVASC) 10 MG tablet Take 10 mg by mouth daily.    [provider]  clindamycin (CLEOCIN) 150 MG capsule Take 1 capsule (150 mg total) by mouth 4 (four) times daily. 02/01/17   Joni Reining, PA-C  clonazePAM (KLONOPIN) 0.5 MG tablet Take 0.5 mg by mouth at bedtime.    [provider]  diphenhydrAMINE (BENADRYL) 25 mg capsule Take 2 capsules (50 mg total) by mouth at bedtime as needed for sleep. 11/24/16   Sharman Cheek, MD  FLUoxetine (PROZAC) 20 MG tablet Take 60 mg by mouth daily.    [provider]  lamoTRIgine (LAMICTAL) 150 MG tablet Take 150 mg by mouth 2 (two) times daily.    [provider]  meloxicam (MOBIC) 15 MG tablet Take 15 mg by mouth as needed.     [provider]  omeprazole (PRILOSEC) 20 MG capsule Take 20 mg by mouth daily.    [provider]  oxyCODONE-acetaminophen (ROXICET) 5-325 MG tablet Take 1 tablet by mouth every 6 (six) hours as needed for moderate pain. 02/01/17   Joni Reining, PA-C  oxyCODONE-acetaminophen (ROXICET) 5-325 MG tablet Take 1 tablet by mouth every 6 (six) hours as needed for moderate pain. 02/05/17  Joni Reining, PA-C    Allergies Penicillins; Doxycycline; Propoxyphene; Sulfa antibiotics; Toradol [ketorolac tromethamine]; and Tramadol  Family History  Problem Relation Age of Onset  . Diabetes Mother   . Hypertension Mother   . Kidney failure Mother   . Pancreatic cancer Father     Social History Social History  Substance Use Topics  . Smoking status: Current Every Day Smoker    Packs/day: 0.25    Types: Cigarettes  . Smokeless tobacco: Never Used  . Alcohol use No    Review of  Systems  Constitutional: No fever/chills Eyes: No visual changes. ENT: No sore throat. Cardiovascular: Denies chest pain. Respiratory: Denies shortness of breath. Gastrointestinal: No abdominal pain.  No nausea, no vomiting.  No diarrhea.  No constipation. Genitourinary: Negative for dysuria. Musculoskeletal: Negative for back pain. Skin: Negative for rash. Neurological: Negative for headaches, focal weakness or numbness. Psychiatric:Anxiety and bipolar Endocrine: Hypertension Allergic/Immunilogical: See medication list ____________________________________________   PHYSICAL EXAM:  VITAL SIGNS: ED Triage Vitals  Enc Vitals Group     BP 02/05/17 1411 120/78     Pulse Rate 02/05/17 1411 98     Resp 02/05/17 1411 18     Temp 02/05/17 1411 98.1 F (36.7 C)     Temp Source 02/05/17 1411 Oral     SpO2 02/05/17 1411 98 %     Weight 02/05/17 1412 211 lb (95.7 kg)     Height 02/05/17 1412 5\' 4"  (1.626 m)     Head Circumference --      Peak Flow --      Pain Score 02/05/17 1411 6     Pain Loc --      Pain Edu? --      Excl. in GC? --     Constitutional: Alert and oriented. Well appearing and in no acute distress. Cardiovascular: Normal rate, regular rhythm. Grossly normal heart sounds.  Good peripheral circulation. Respiratory: Normal respiratory effort.  No retractions. Lungs CTAB. Musculoskeletal: No lower extremity tenderness nor edema.  No joint effusions. Neurologic:  Normal speech and language. No gross focal neurologic deficits are appreciated. No gait instability. Skin:  Ruptured papular lesion on erythematous base. Moderate guarding palpation.  Psychiatric: Mood and affect are normal. Speech and behavior are normal.  ____________________________________________   LABS (all labs ordered are listed, but only abnormal results are displayed)  Labs Reviewed - No data to  display ____________________________________________  EKG   ____________________________________________  RADIOLOGY  No results found.  ____________________________________________   PROCEDURES  Procedure(s) performed: None  Procedures  Critical Care performed: No  ____________________________________________   INITIAL IMPRESSION / ASSESSMENT AND PLAN / ED COURSE  Pertinent labs & imaging results that were available during my care of the patient were reviewed by me and considered in my medical decision making (see chart for details).  Right elbow pain secondary to insect bite. Patient given discharge Instructions. Patient advised to continue previous medications. Patient [facility]      ____________________________________________   FINAL CLINICAL IMPRESSION(S) / ED DIAGNOSES  Final diagnoses:  Bug bite with infection, subsequent encounter      NEW MEDICATIONS STARTED DURING THIS VISIT:  New Prescriptions   OXYCODONE-ACETAMINOPHEN (ROXICET) 5-325 MG TABLET    Take 1 tablet by mouth every 6 (six) hours as needed for moderate pain.     Note:  This document was prepared using Dragon voice recognition software and may include unintentional dictation errors.  Joni ReiningSmith, Ronald K, PA-C 02/05/17 1521    Minna AntisPaduchowski, Kevin, MD 02/06/17 [phone]

## 2017-02-05 NOTE — ED Notes (Signed)
Pt reports insect bite to right elbow, reports she came to ER on Tuesday taking her antibiotics reports yesterday she hit elbow at work and reports pain.Pt reports not sure what insect bit her.  Denies any other symptom

## 2017-02-05 NOTE — ED Triage Notes (Signed)
Insect bite R elbow x 1 week, has opened and drained.

## 2017-02-05 NOTE — Discharge Instructions (Signed)
Keep area bandaged, apply warm compresses twice daily as directed, and take antibiotics as directed.

## 2017-02-08 ENCOUNTER — Encounter: Payer: Self-pay | Admitting: Emergency Medicine

## 2017-02-08 ENCOUNTER — Emergency Department
Admission: EM | Admit: 2017-02-08 | Discharge: 2017-02-08 | Disposition: A | Discharge status: Home / Self Care | Payer: Self-pay | Attending: Student in an Organized Health Care Education/Training Program | Admitting: Student in an Organized Health Care Education/Training Program

## 2017-02-08 DIAGNOSIS — F17210 Nicotine dependence, cigarettes, uncomplicated: Secondary | ICD-10-CM | POA: Insufficient documentation

## 2017-02-08 DIAGNOSIS — W57XXXA Bitten or stung by nonvenomous insect and other nonvenomous arthropods, initial encounter: Secondary | ICD-10-CM | POA: Insufficient documentation

## 2017-02-08 DIAGNOSIS — F319 Bipolar disorder, unspecified: Secondary | ICD-10-CM | POA: Insufficient documentation

## 2017-02-08 DIAGNOSIS — Z79899 Other long term (current) drug therapy: Secondary | ICD-10-CM | POA: Insufficient documentation

## 2017-02-08 DIAGNOSIS — Y929 Unspecified place or not applicable: Secondary | ICD-10-CM | POA: Insufficient documentation

## 2017-02-08 DIAGNOSIS — S60463A Insect bite (nonvenomous) of left middle finger, initial encounter: Secondary | ICD-10-CM | POA: Insufficient documentation

## 2017-02-08 DIAGNOSIS — Z7982 Long term (current) use of aspirin: Secondary | ICD-10-CM | POA: Insufficient documentation

## 2017-02-08 DIAGNOSIS — Y9389 Activity, other specified: Secondary | ICD-10-CM | POA: Insufficient documentation

## 2017-02-08 DIAGNOSIS — I10 Essential (primary) hypertension: Secondary | ICD-10-CM | POA: Insufficient documentation

## 2017-02-08 DIAGNOSIS — Y990 Civilian activity done for income or pay: Secondary | ICD-10-CM | POA: Insufficient documentation

## 2017-02-08 MED ORDER — HYDROXYZINE HCL 50 MG PO TABS: 50.0000 mg | ORAL_TABLET | Freq: Three times a day (TID) | ORAL | 0 refills | Status: AC | PRN

## 2017-02-08 MED ORDER — OXYCODONE-ACETAMINOPHEN 5-325 MG PO TABS: 1.0000 | ORAL_TABLET | Freq: Four times a day (QID) | ORAL | 0 refills | Status: AC | PRN

## 2017-02-08 NOTE — ED Triage Notes (Signed)
Pt here for a spider bite to right arm a week ago.  Has 2 new ones now also.  She would also like to get the other one checked again while here. Ambulatory to triage.  NAD. VSS

## 2017-02-08 NOTE — ED Notes (Signed)
See triage note   States she was bitten by couple of spiders.  First bite was about 1 1/2 weeks ago to right arm  Now noticed 2 additional bites to fingers

## 2017-02-08 NOTE — Discharge Instructions (Signed)
Continue previous medication and work gloves at work.

## 2017-02-08 NOTE — ED Provider Notes (Signed)
[facility] Emergency Department Provider Note   ____________________________________________   First MD Initiated Contact with Patient 02/08/17 1502     (approximate)  I have reviewed the triage vital signs and the nursing notes.   HISTORY  Chief Complaint Insect Bite    HPI Robin Conway is a 51 y.o. female patient is insect bite to the third digit left hand. Patient state she's been bitten while at work. Patient has job requires housekeeping duties. Patient stated previously she has spider bite to the right arm which is healing well after starting antibiotics. Patient stated last 2 by secure 2 days ago. Patient rates the pain as a 6/10.No palliative measures for complaint.   Past Medical History:  Diagnosis Date  . Anxiety   . Bipolar 1 disorder, depressed, severe (HCC)   . Bronchitis   . DDD (degenerative disc disease), lumbar   . Degenerative disc disease, lumbar   . Dyspnea    with exertion  . GERD (gastroesophageal reflux disease)   . History of kidney stones   . Hypertension   . IBS (irritable bowel syndrome)   . Mitral valve disorder     Patient Active Problem List   Diagnosis Date Noted  . UTI (urinary tract infection) 11/17/2016  . Postmenopausal bleeding 11/09/2016  . Suprapubic abdominal pain 11/09/2016  . Fall 07/30/2016  . Acute midline low back pain without sciatica 07/30/2016    Past Surgical History:  Procedure Laterality Date  . APPENDECTOMY    . DILATATION & CURETTAGE/HYSTEROSCOPY WITH MYOSURE N/A 12/10/2016   Procedure: DILATATION & CURETTAGE/HYSTEROSCOPY Jackquline Denmark POLYPECTOMY;  Surgeon: Conard Novak, MD;  Location: [facility] ORS;  Service: Gynecology;  Laterality: N/A;  . FRACTURE SURGERY Left    Arm  . HYSTEROSCOPY    . TUBAL LIGATION      Prior to Admission medications   Medication Sig Start Date End Date Taking? Authorizing Provider  acetaminophen-codeine (TYLENOL #3) 300-30 MG tablet Take 1 tablet by mouth  daily as needed for moderate pain. 01/06/17   Conard Novak, MD  amLODipine (NORVASC) 10 MG tablet Take 10 mg by mouth daily.    [provider]  clindamycin (CLEOCIN) 150 MG capsule Take 1 capsule (150 mg total) by mouth 4 (four) times daily. 02/01/17   Joni Reining, PA-C  clonazePAM (KLONOPIN) 0.5 MG tablet Take 0.5 mg by mouth at bedtime.    [provider]  diphenhydrAMINE (BENADRYL) 25 mg capsule Take 2 capsules (50 mg total) by mouth at bedtime as needed for sleep. 11/24/16   Sharman Cheek, MD  FLUoxetine (PROZAC) 20 MG tablet Take 60 mg by mouth daily.    [provider]  hydrOXYzine (ATARAX/VISTARIL) 50 MG tablet Take 1 tablet (50 mg total) by mouth 3 (three) times daily as needed. 02/08/17   Joni Reining, PA-C  lamoTRIgine (LAMICTAL) 150 MG tablet Take 150 mg by mouth 2 (two) times daily.    [provider]  meloxicam (MOBIC) 15 MG tablet Take 15 mg by mouth as needed.     [provider]  omeprazole (PRILOSEC) 20 MG capsule Take 20 mg by mouth daily.    [provider]  oxyCODONE-acetaminophen (ROXICET) 5-325 MG tablet Take 1 tablet by mouth every 6 (six) hours as needed for moderate pain. 02/01/17   Joni Reining, PA-C  oxyCODONE-acetaminophen (ROXICET) 5-325 MG tablet Take 1 tablet by mouth every 6 (six) hours as needed for moderate pain. 02/05/17   Nona Dell  K, PA-C  oxyCODONE-acetaminophen (ROXICET) 5-325 MG tablet Take 1 tablet by mouth every 6 (six) hours as needed for moderate pain. 02/08/17   Joni ReiningSmith, Ronald K, PA-C    Allergies Penicillins; Doxycycline; Propoxyphene; Sulfa antibiotics; Toradol [ketorolac tromethamine]; and Tramadol  Family History  Problem Relation Age of Onset  . Diabetes Mother   . Hypertension Mother   . Kidney failure Mother   . Pancreatic cancer Father     Social History Social History  Substance Use Topics  . Smoking status: Current Every Day Smoker    Packs/day: 0.25    Types:  Cigarettes  . Smokeless tobacco: Never Used  . Alcohol use No    Review of Systems  Constitutional: No fever/chills Eyes: No visual changes. ENT: No sore throat. Cardiovascular: Denies chest pain. Respiratory: Denies shortness of breath. Gastrointestinal: No abdominal pain.  No nausea, no vomiting.  No diarrhea.  No constipation. Genitourinary: Negative for dysuria. Musculoskeletal: Negative for back pain. Skin: Negative for rash. Neurological: Negative for headaches, focal weakness or numbness. Psychiatric:Anxiety Endocrine:Hypertension Allergic/Immunilogical: Extensive medication list ____________________________________________   PHYSICAL EXAM:  VITAL SIGNS: ED Triage Vitals [02/08/17 1405]  Enc Vitals Group     BP (!) 150/89     Pulse Rate (!) 101     Resp 16     Temp 98.5 F (36.9 C)     Temp Source Oral     SpO2 98 %     Weight 211 lb (95.7 kg)     Height 5\' 4"  (1.626 m)     Head Circumference      Peak Flow      Pain Score 6     Pain Loc      Pain Edu?      Excl. in GC?     Constitutional: Alert and oriented. Well appearing and in no acute distress. Cardiovascular: Normal rate, regular rhythm. Grossly normal heart sounds.  Good peripheral circulation. Respiratory: Normal respiratory effort.  No retractions. Lungs CTAB. Gastrointestinal: Soft and nontender. No distention. No abdominal bruits. No CVA tenderness. Musculoskeletal: No lower extremity tenderness nor edema.  No joint effusions. Neurologic:  Normal speech and language. No gross focal neurologic deficits are appreciated. No gait instability. Skin:  Skin is warm, dry and intact. No rash noted.He did not was erythematous papular lesion second and third digit left hand. Psychiatric: Mood and affect are normal. Speech and behavior are normal.  ____________________________________________   LABS (all labs ordered are listed, but only abnormal results are displayed)  Labs Reviewed - No data to  display ____________________________________________  EKG   ____________________________________________  RADIOLOGY  No results found.  ____________________________________________   PROCEDURES  Procedure(s) performed: None  Procedures  Critical Care performed: No  ____________________________________________   INITIAL IMPRESSION / ASSESSMENT AND PLAN / ED COURSE  Pertinent labs & imaging results that were available during my care of the patient were reviewed by me and considered in my medical decision making (see chart for details).  Infected insect bite secondary digit left hand. Patient given discharge care instructions. 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      ____________________________________________   FINAL CLINICAL IMPRESSION(S) / ED DIAGNOSES  Final diagnoses:  Insect bite, initial encounter      NEW MEDICATIONS STARTED DURING THIS VISIT:  Discharge Medication List as of 02/08/2017  3:11 PM    START taking these medications   Details  hydrOXYzine (ATARAX/VISTARIL) 50 MG tablet Take 1 tablet (50 mg total)  by mouth 3 (three) times daily as needed., Starting Tue 02/08/2017, Print    !! oxyCODONE-acetaminophen (ROXICET) 5-325 MG tablet Take 1 tablet by mouth every 6 (six) hours as needed for moderate pain., Starting Tue 02/08/2017, Print     !! - Potential duplicate medications found. Please discuss with provider.       Note:  This document was prepared using Dragon voice recognition software and may include unintentional dictation errors.    Joni Reining, PA-C 02/08/17 1751    Willy Eddy, MD 02/09/17 [phone]

## 2017-02-17 ENCOUNTER — Encounter: Payer: Self-pay | Admitting: Emergency Medicine

## 2017-02-17 ENCOUNTER — Emergency Department
Admission: EM | Admit: 2017-02-17 | Discharge: 2017-02-17 | Disposition: A | Discharge status: Home / Self Care | Payer: Self-pay | Attending: Emergency Medicine | Admitting: Emergency Medicine

## 2017-02-17 DIAGNOSIS — L538 Other specified erythematous conditions: Secondary | ICD-10-CM | POA: Insufficient documentation

## 2017-02-17 DIAGNOSIS — F17210 Nicotine dependence, cigarettes, uncomplicated: Secondary | ICD-10-CM | POA: Insufficient documentation

## 2017-02-17 DIAGNOSIS — I10 Essential (primary) hypertension: Secondary | ICD-10-CM | POA: Insufficient documentation

## 2017-02-17 DIAGNOSIS — W57XXXD Bitten or stung by nonvenomous insect and other nonvenomous arthropods, subsequent encounter: Secondary | ICD-10-CM | POA: Insufficient documentation

## 2017-02-17 DIAGNOSIS — Z79899 Other long term (current) drug therapy: Secondary | ICD-10-CM | POA: Insufficient documentation

## 2017-02-17 DIAGNOSIS — W57XXXA Bitten or stung by nonvenomous insect and other nonvenomous arthropods, initial encounter: Secondary | ICD-10-CM

## 2017-02-17 MED ORDER — NAPROXEN 500 MG PO TABS: 500.0000 mg | ORAL_TABLET | Freq: Two times a day (BID) | ORAL | 0 refills | Status: AC

## 2017-02-17 MED ORDER — CLINDAMYCIN HCL 300 MG PO CAPS
300.0000 mg | ORAL_CAPSULE | Freq: Once | ORAL | Status: AC
  Administered 2017-02-17: 300 mg via ORAL
  Filled 2017-02-17: qty 1
  Filled 2017-02-17: qty 2

## 2017-02-17 MED ORDER — NAPROXEN 500 MG PO TABS
500.0000 mg | ORAL_TABLET | Freq: Once | ORAL | Status: DC
  Filled 2017-02-17: qty 1

## 2017-02-17 MED ORDER — CLINDAMYCIN HCL 300 MG PO CAPS: 300.0000 mg | ORAL_CAPSULE | Freq: Three times a day (TID) | ORAL | 0 refills | Status: AC

## 2017-02-17 NOTE — ED Triage Notes (Signed)
Pt here for right elbow insect bite. Finished antibiotics on June 29, took them all. States that 2 days ago wound started getting red and having increasing pain. 4 visit for same. NAD, vs wnl.

## 2017-02-17 NOTE — Discharge Instructions (Signed)
Return to the ER if you notice worsening redness or if you have a fever.

## 2017-02-17 NOTE — ED Provider Notes (Signed)
[facility] Emergency Department Provider Note  ____________________________________________  Time seen: Approximately 6:09 PM  I have reviewed the triage vital signs and the nursing notes.   HISTORY  Chief Complaint Insect Bite   HPI Robin Conway is a 51 y.o. female who presents for evaluation of an insect bite. This is patient's fourth visitto the emergency room for insect bites. She reports that she was bitten by an unknown insect on her right elbow region. She was given clindamycin and felt markedly improved. She finished her prescription 2 days ago and since yesterday has noticed worsening redness surrounded and also worsening pain. She hasn't tried anything at home for the pain. No fever or chills. No pain with range of motion of her elbow. She is complaining of moderate constant pain located at the site of the insect bite. She is not sure which insect bit her. She denies tick bites.  Past Medical History:  Diagnosis Date  . Anxiety   . Bipolar 1 disorder, depressed, severe (HCC)   . Bronchitis   . DDD (degenerative disc disease), lumbar   . Degenerative disc disease, lumbar   . Dyspnea    with exertion  . GERD (gastroesophageal reflux disease)   . History of kidney stones   . Hypertension   . IBS (irritable bowel syndrome)   . Mitral valve disorder     Patient Active Problem List   Diagnosis Date Noted  . UTI (urinary tract infection) 11/17/2016  . Postmenopausal bleeding 11/09/2016  . Suprapubic abdominal pain 11/09/2016  . Fall 07/30/2016  . Acute midline low back pain without sciatica 07/30/2016    Past Surgical History:  Procedure Laterality Date  . APPENDECTOMY    . DILATATION & CURETTAGE/HYSTEROSCOPY WITH MYOSURE N/A 12/10/2016   Procedure: DILATATION & CURETTAGE/HYSTEROSCOPY Jackquline Denmark/MYOSURE POLYPECTOMY;  Surgeon: Conard NovakStephen D Jackson, MD;  Location: [facility] ORS;  Service: Gynecology;  Laterality: N/A;  . FRACTURE SURGERY Left    Arm  .  HYSTEROSCOPY    . TUBAL LIGATION      Prior to Admission medications   Medication Sig Start Date End Date Taking? Authorizing Provider  acetaminophen-codeine (TYLENOL #3) 300-30 MG tablet Take 1 tablet by mouth daily as needed for moderate pain. 01/06/17   Conard NovakJackson, Stephen D, MD  amLODipine (NORVASC) 10 MG tablet Take 10 mg by mouth daily.    [provider]  clindamycin (CLEOCIN) 300 MG capsule Take 1 capsule (300 mg total) by mouth 3 (three) times daily. 02/17/17 02/27/17  Nita SickleVeronese, [place], MD  clonazePAM (KLONOPIN) 0.5 MG tablet Take 0.5 mg by mouth at bedtime.    [provider]  diphenhydrAMINE (BENADRYL) 25 mg capsule Take 2 capsules (50 mg total) by mouth at bedtime as needed for sleep. 11/24/16   Sharman CheekStafford, Phillip, MD  FLUoxetine (PROZAC) 20 MG tablet Take 60 mg by mouth daily.    [provider]  hydrOXYzine (ATARAX/VISTARIL) 50 MG tablet Take 1 tablet (50 mg total) by mouth 3 (three) times daily as needed. 02/08/17   Joni ReiningSmith, Ronald K, PA-C  lamoTRIgine (LAMICTAL) 150 MG tablet Take 150 mg by mouth 2 (two) times daily.    [provider]  meloxicam (MOBIC) 15 MG tablet Take 15 mg by mouth as needed.     [provider]  naproxen (NAPROSYN) 500 MG tablet Take 1 tablet (500 mg total) by mouth 2 (two) times daily with a meal. 02/17/17 02/17/18  Don PerkingVeronese, [place], MD  omeprazole (PRILOSEC) 20 MG capsule Take 20  mg by mouth daily.    [provider]  oxyCODONE-acetaminophen (ROXICET) 5-325 MG tablet Take 1 tablet by mouth every 6 (six) hours as needed for moderate pain. 02/01/17   Joni Reining, PA-C  oxyCODONE-acetaminophen (ROXICET) 5-325 MG tablet Take 1 tablet by mouth every 6 (six) hours as needed for moderate pain. 02/05/17   Joni Reining, PA-C  oxyCODONE-acetaminophen (ROXICET) 5-325 MG tablet Take 1 tablet by mouth every 6 (six) hours as needed for moderate pain. 02/08/17   Joni Reining, PA-C    Allergies Penicillins; Doxycycline;  Propoxyphene; Sulfa antibiotics; Toradol [ketorolac tromethamine]; and Tramadol  Family History  Problem Relation Age of Onset  . Diabetes Mother   . Hypertension Mother   . Kidney failure Mother   . Pancreatic cancer Father     Social History Social History  Substance Use Topics  . Smoking status: Current Every Day Smoker    Packs/day: 0.25    Types: Cigarettes  . Smokeless tobacco: Never Used  . Alcohol use No    Review of Systems  Constitutional: Negative for fever. Eyes: Negative for visual changes. ENT: Negative for sore throat. Neck: No neck pain  Cardiovascular: Negative for chest pain. Respiratory: Negative for shortness of breath. Gastrointestinal: Negative for abdominal pain, vomiting or diarrhea. Genitourinary: Negative for dysuria. Musculoskeletal: Negative for back pain. Skin: Negative for rash. + insect bite Neurological: Negative for headaches, weakness or numbness. Psych: No SI or HI  ____________________________________________   PHYSICAL EXAM:  VITAL SIGNS: ED Triage Vitals  Enc Vitals Group     BP 02/17/17 1737 (!) 145/95     Pulse Rate 02/17/17 1737 90     Resp --      Temp 02/17/17 1737 98.6 F (37 C)     Temp Source 02/17/17 1737 Oral     SpO2 02/17/17 1737 94 %     Weight 02/17/17 1738 211 lb (95.7 kg)     Height 02/17/17 1738 5\' 4"  (1.626 m)     Head Circumference --      Peak Flow --      Pain Score 02/17/17 1737 6     Pain Loc --      Pain Edu? --      Excl. in GC? --     Constitutional: Alert and oriented. Well appearing and in no apparent distress. HEENT:      Head: Normocephalic and atraumatic.         Eyes: Conjunctivae are normal. Sclera is non-icteric.       Mouth/Throat: Mucous membranes are moist.       Neck: Supple with no signs of meningismus. Cardiovascular: Regular rate and rhythm. No murmurs, gallops, or rubs. 2+ symmetrical distal pulses are present in all extremities. No JVD. Respiratory: Normal respiratory  effort. Lungs are clear to auscultation bilaterally. No wheezes, crackles, or rhonchi.  Musculoskeletal: Full painless range of motion of all joints in the right upper extremity  Neurologic: Normal speech and language. Face is symmetric. Moving all extremities. No gross focal neurologic deficits are appreciated. Skin: There is a 0.25cm insect bite over the dorsal aspect of the R elbow with very minimal surrounding erythema, no pus Psychiatric: Mood and affect are normal. Speech and behavior are normal.  ____________________________________________   LABS (all labs ordered are listed, but only abnormal results are displayed)  Labs Reviewed - No data to display ____________________________________________  EKG  none  ____________________________________________  RADIOLOGY  none  ____________________________________________   PROCEDURES  Procedure(s) performed: None Procedures Critical Care performed:  None ____________________________________________   INITIAL IMPRESSION / ASSESSMENT AND PLAN / ED COURSE  51 y.o. female who presents for evaluation of an insect bite. Patient is extremely well-appearing, has a small insect bite with minimal amount of erythema surrounding it. No pus. No evidence of septic joint. Patient is requesting refill prescription of clindamycin since she reports that the pain and the swelling were better while on the antibiotics. She is allergic to every other antibiotics that I would use to treat this infection such as Bactrim and doxycycline. We'll refill the prescription for clindamycin at this time. Discussed signs and symptoms of septic joint and cellulitis with patient and recommended she return if these develop. Patient is also requesting refill for Percocets. I feel very uncomfortable prescribing patient any Percocets or opiates at this time since this is her fourth visit and during her 3 prior visits patient received 12 Percocets each time for a total of  36 with the last prescription being 2 weeks ago.      Pertinent labs & imaging results that were available during my care of the patient were reviewed by me and considered in my medical decision making (see chart for details).    ____________________________________________   FINAL CLINICAL IMPRESSION(S) / ED DIAGNOSES  Final diagnoses:  Insect bite, initial encounter      NEW MEDICATIONS STARTED DURING THIS VISIT:  New Prescriptions   CLINDAMYCIN (CLEOCIN) 300 MG CAPSULE    Take 1 capsule (300 mg total) by mouth 3 (three) times daily.   NAPROXEN (NAPROSYN) 500 MG TABLET    Take 1 tablet (500 mg total) by mouth 2 (two) times daily with a meal.     Note:  This document was prepared using Dragon voice recognition software and may include unintentional dictation errors.    Don Perking, [place], MD 02/17/17 [phone]

## 2017-02-17 NOTE — ED Notes (Signed)
Patient has a small wound to her right elbow.  Patient is in no obvious distress at this time.

## 2017-12-04 IMAGING — CR DG LUMBAR SPINE 2-3V
3 series · 3 of 3 positions shown · non-contrast
Comparison: 05/12/2015

CLINICAL DATA: Status post fall.

EXAM:
LUMBAR SPINE - 2-3 VIEW

[l-spine ap]
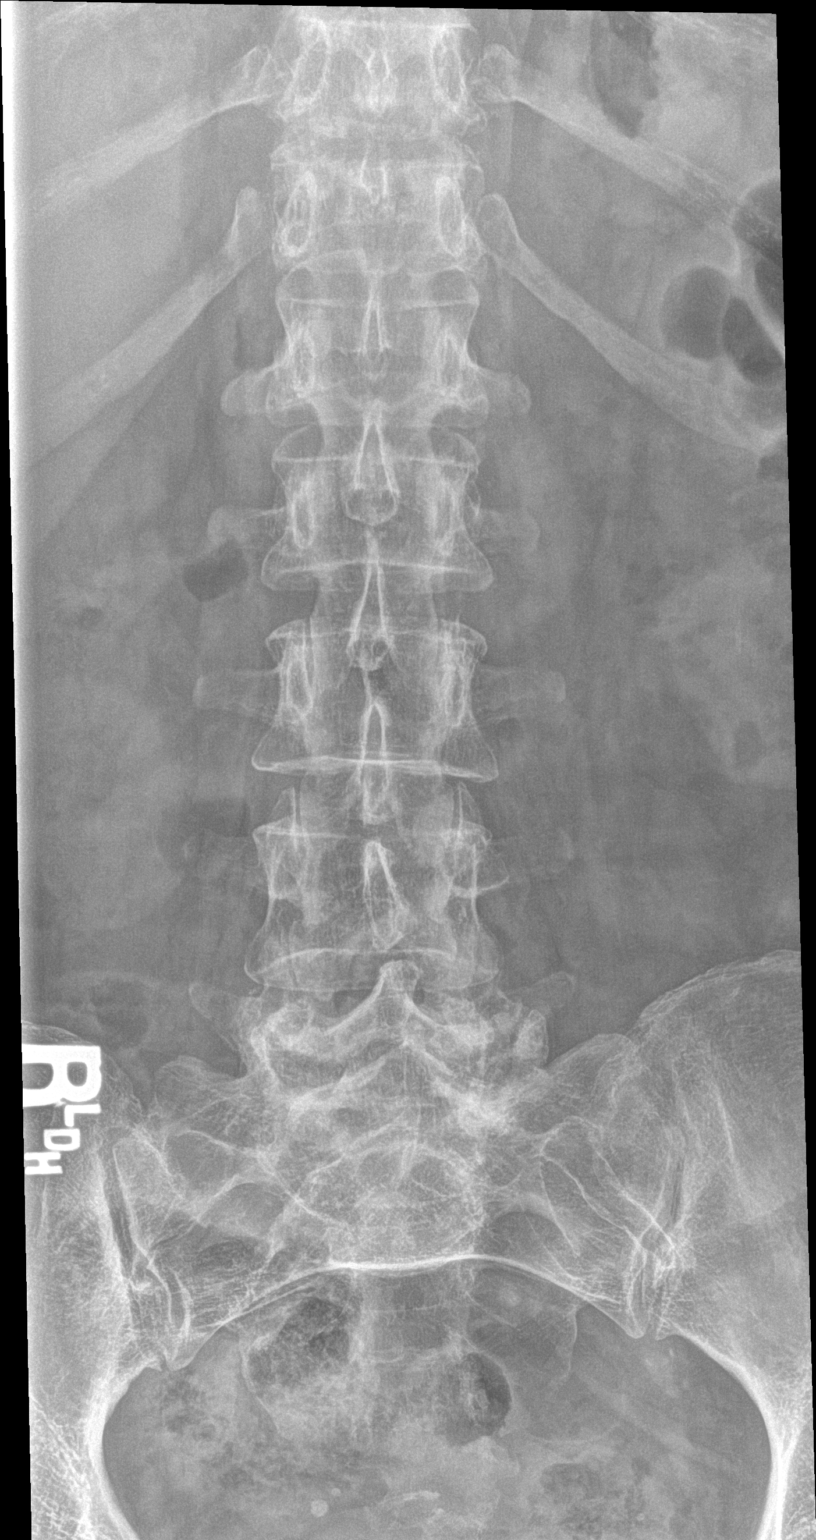

[l-spine lat]
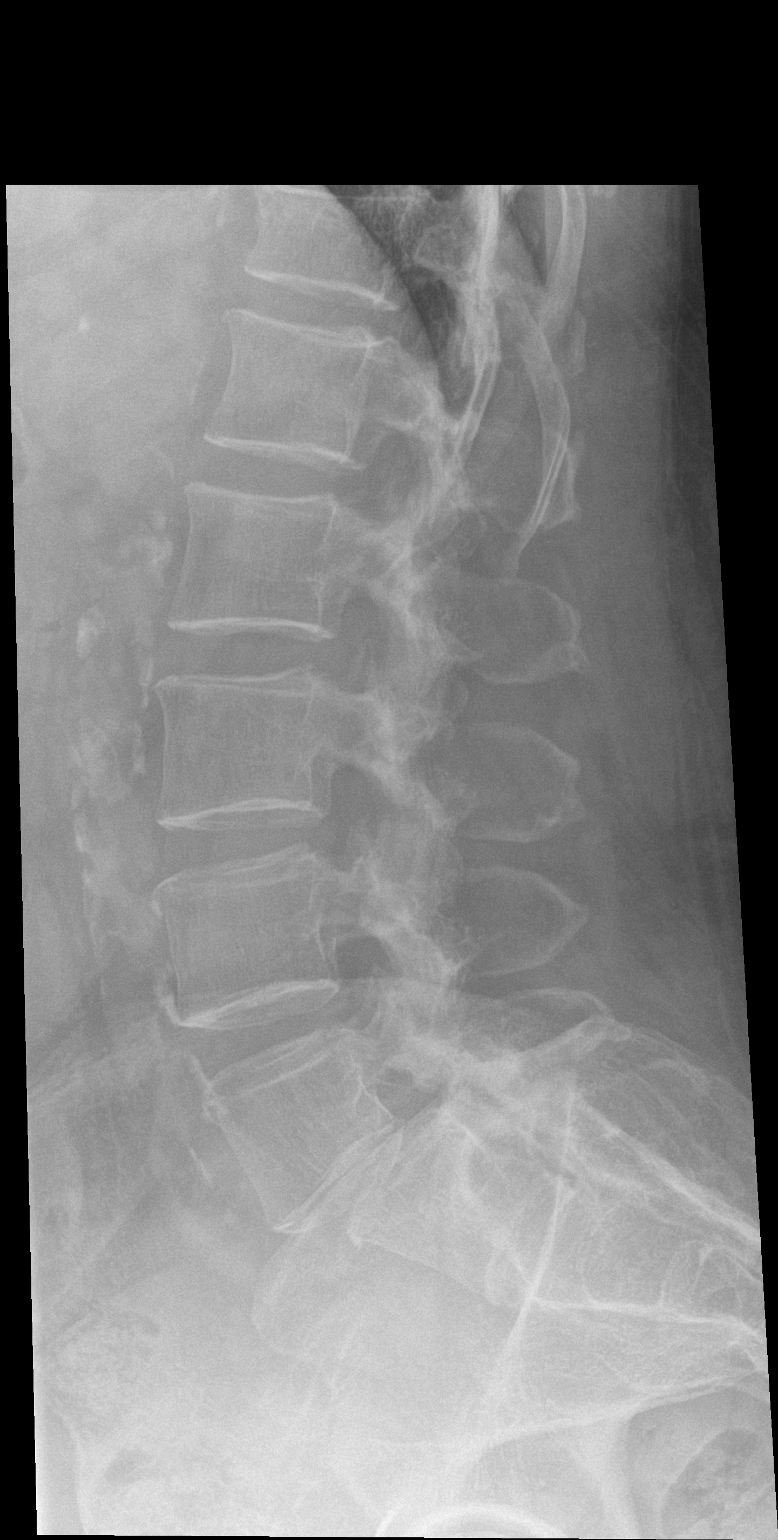

[l-spine spot]
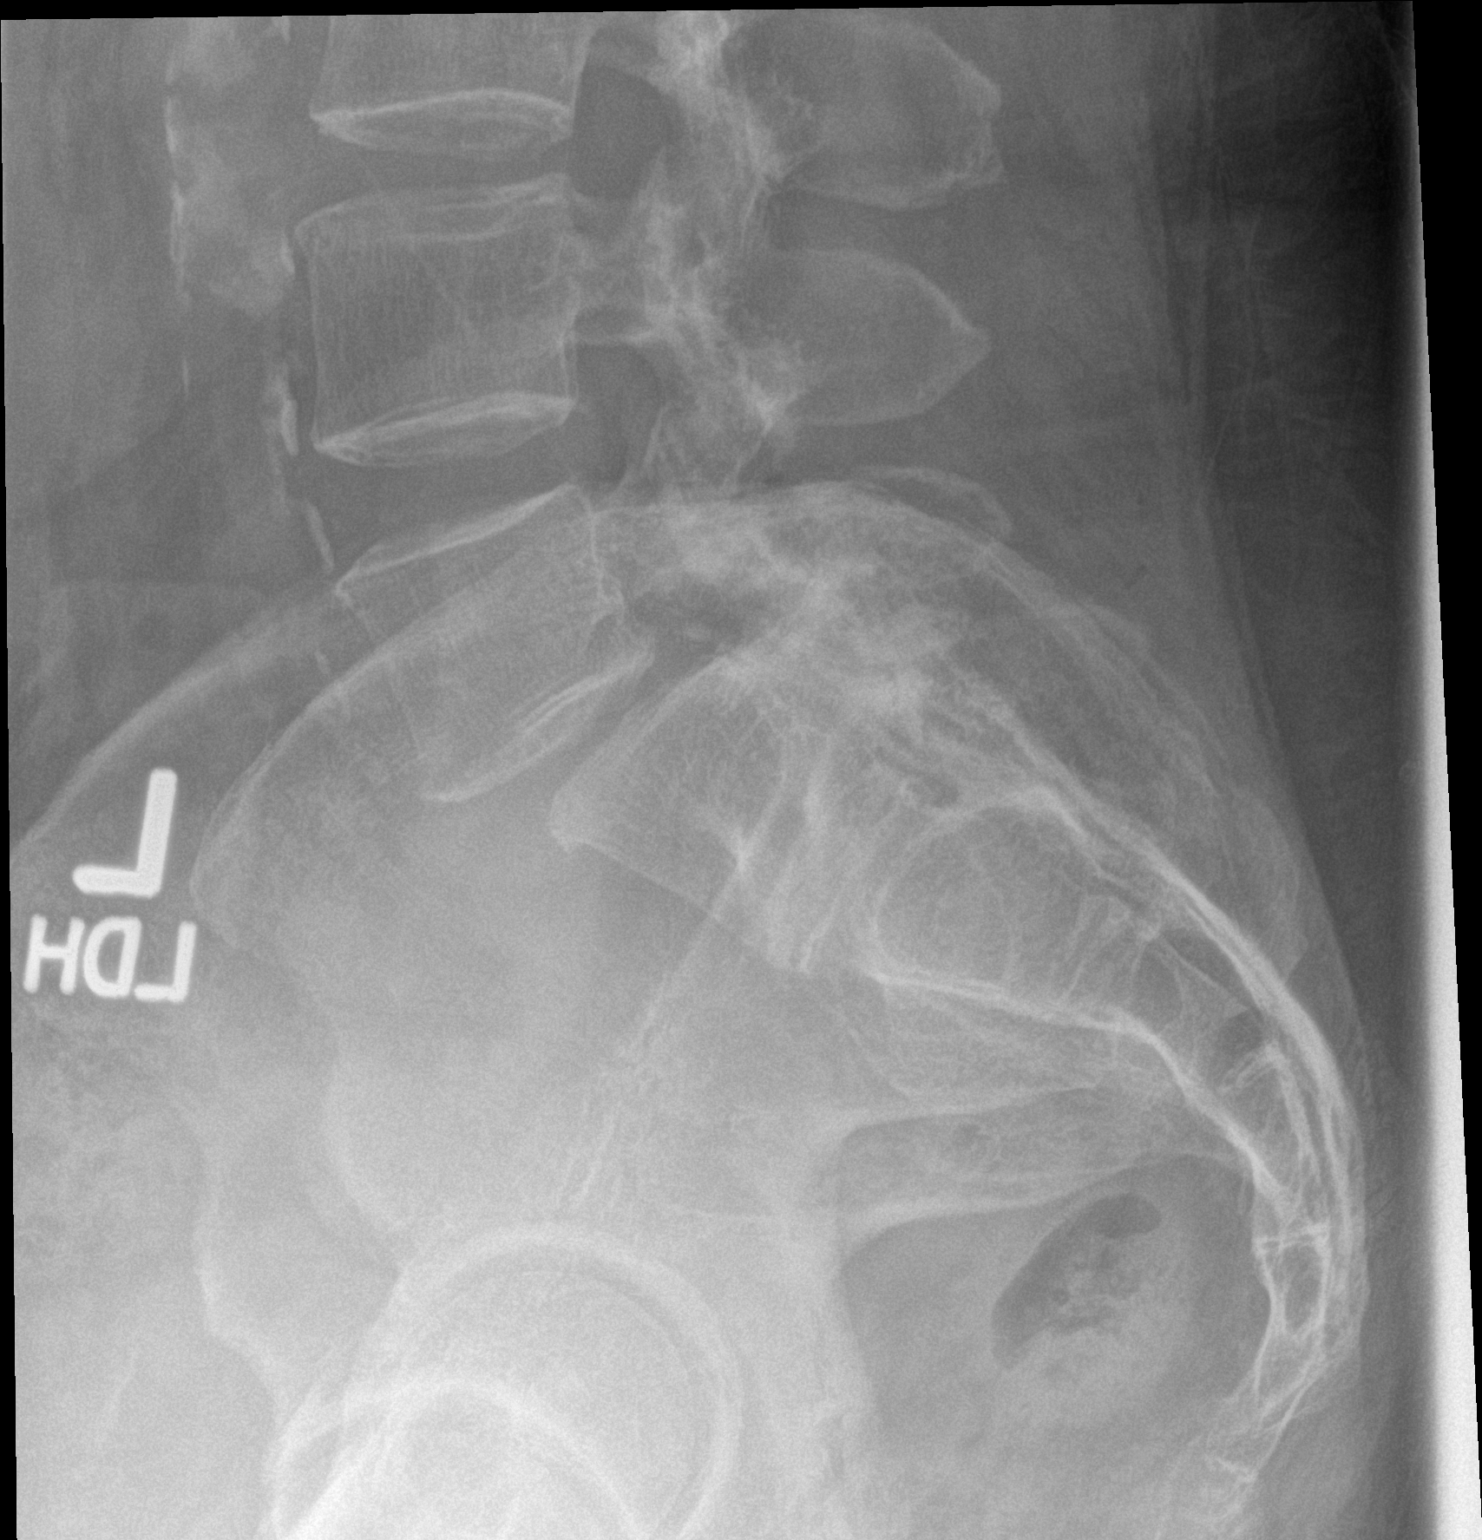

[3 of 3 positions shown; findings below may reference images not displayed]

FINDINGS: There are 5 nonrib bearing lumbar-type vertebral bodies.

There is a chronic T12 vertebral body compression fracture. The
remainder the vertebral body heights are maintained.

There is no spondylolysis. There is grade 1 anterolisthesis of L5 on
S1 secondary to bilateral facet arthropathy.

There is no acute fracture.

There is degenerative disc disease with disc height loss at L5-S1.
There is bilateral facet arthropathy throughout the lumbar spine.

The SI joints are unremarkable.

There is abdominal aortic atherosclerosis.
IMPRESSION: No acute osseous injury of the lumbar spine.

## 2017-12-04 IMAGING — CR DG SACRUM/COCCYX 2+V
3 series · 3 of 3 positions shown · non-contrast
Comparison: October 03, 2015

CLINICAL DATA: Pain after fall

EXAM:
SACRUM AND COCCYX - 2+ VIEW

[coccyx ap]
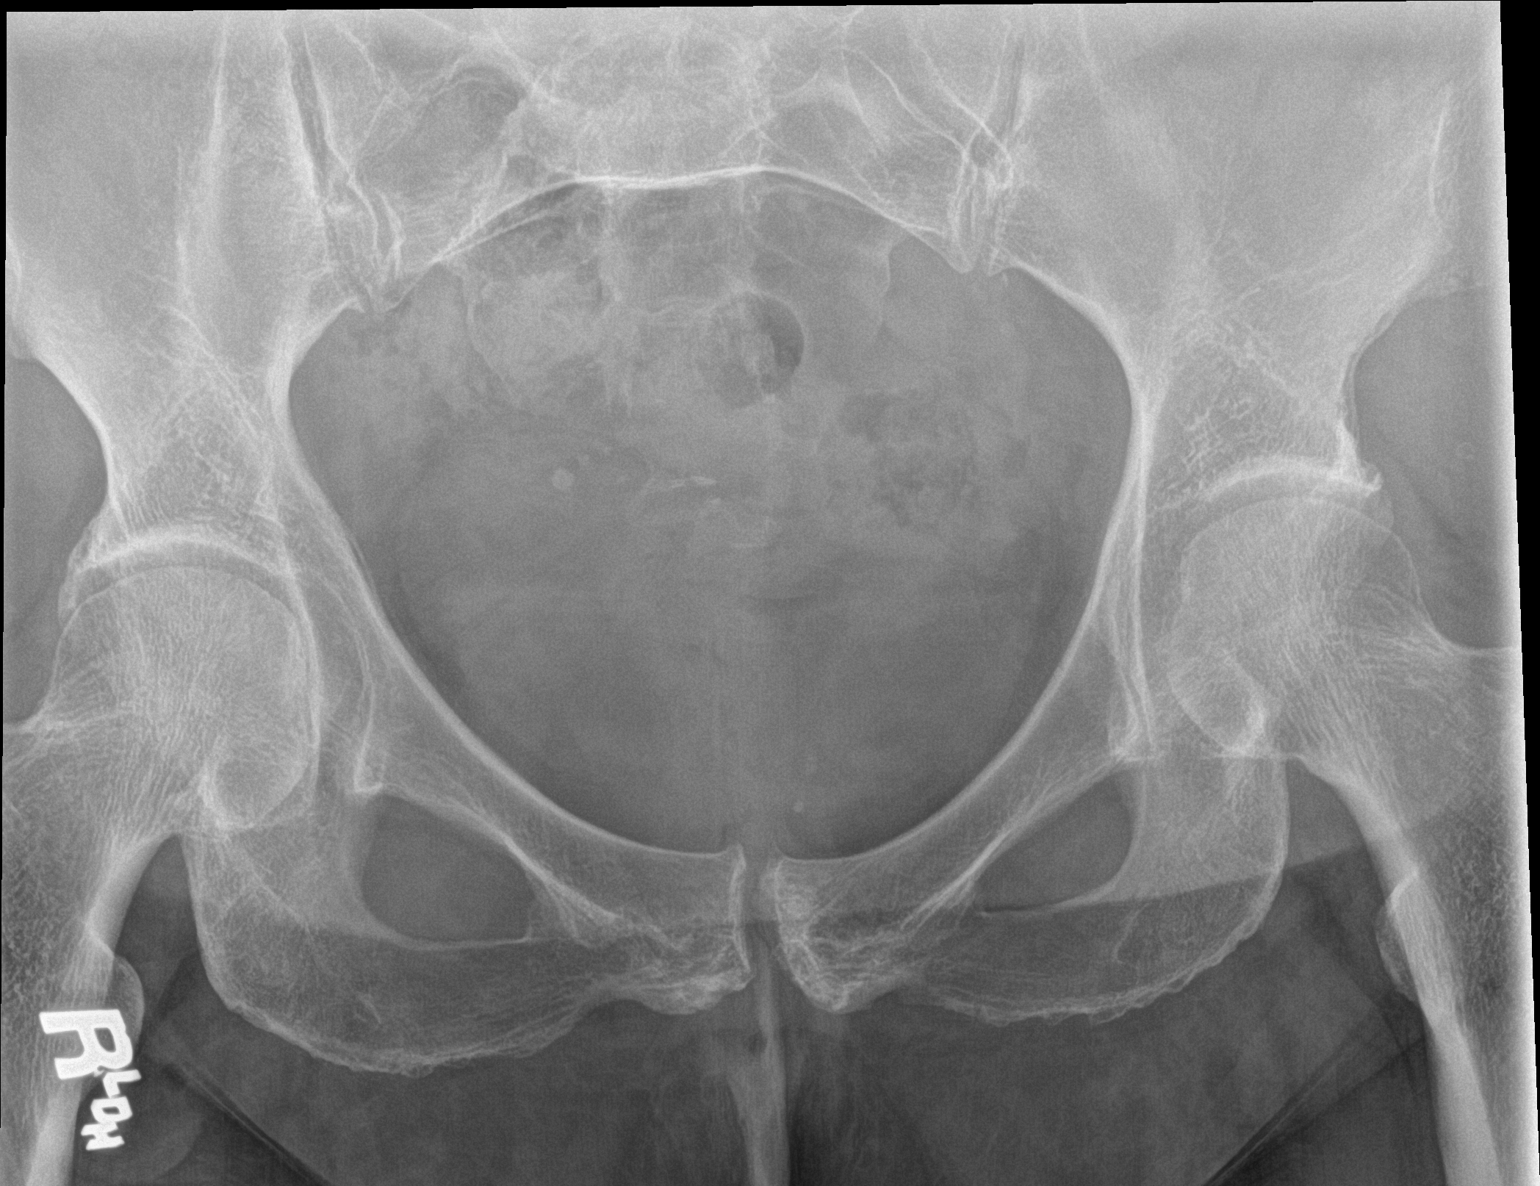

[sacrum ap]
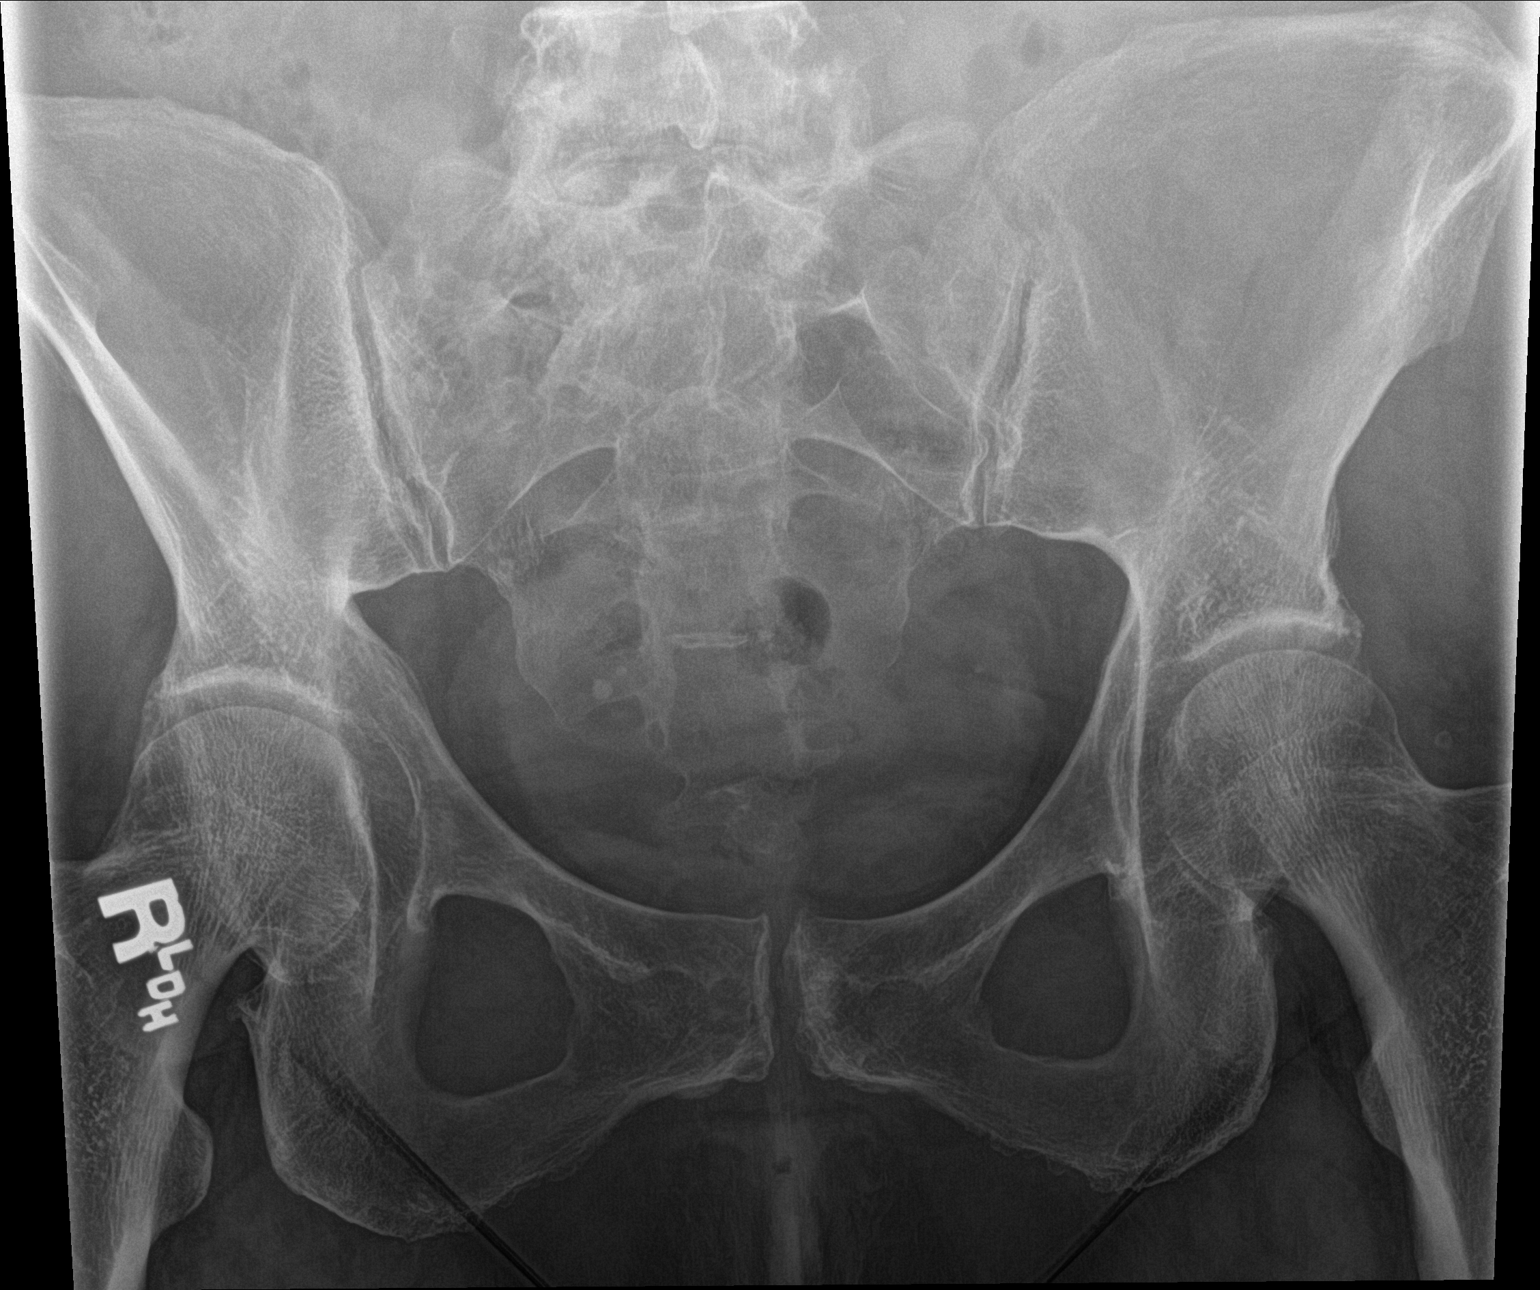

[sacrum lat]
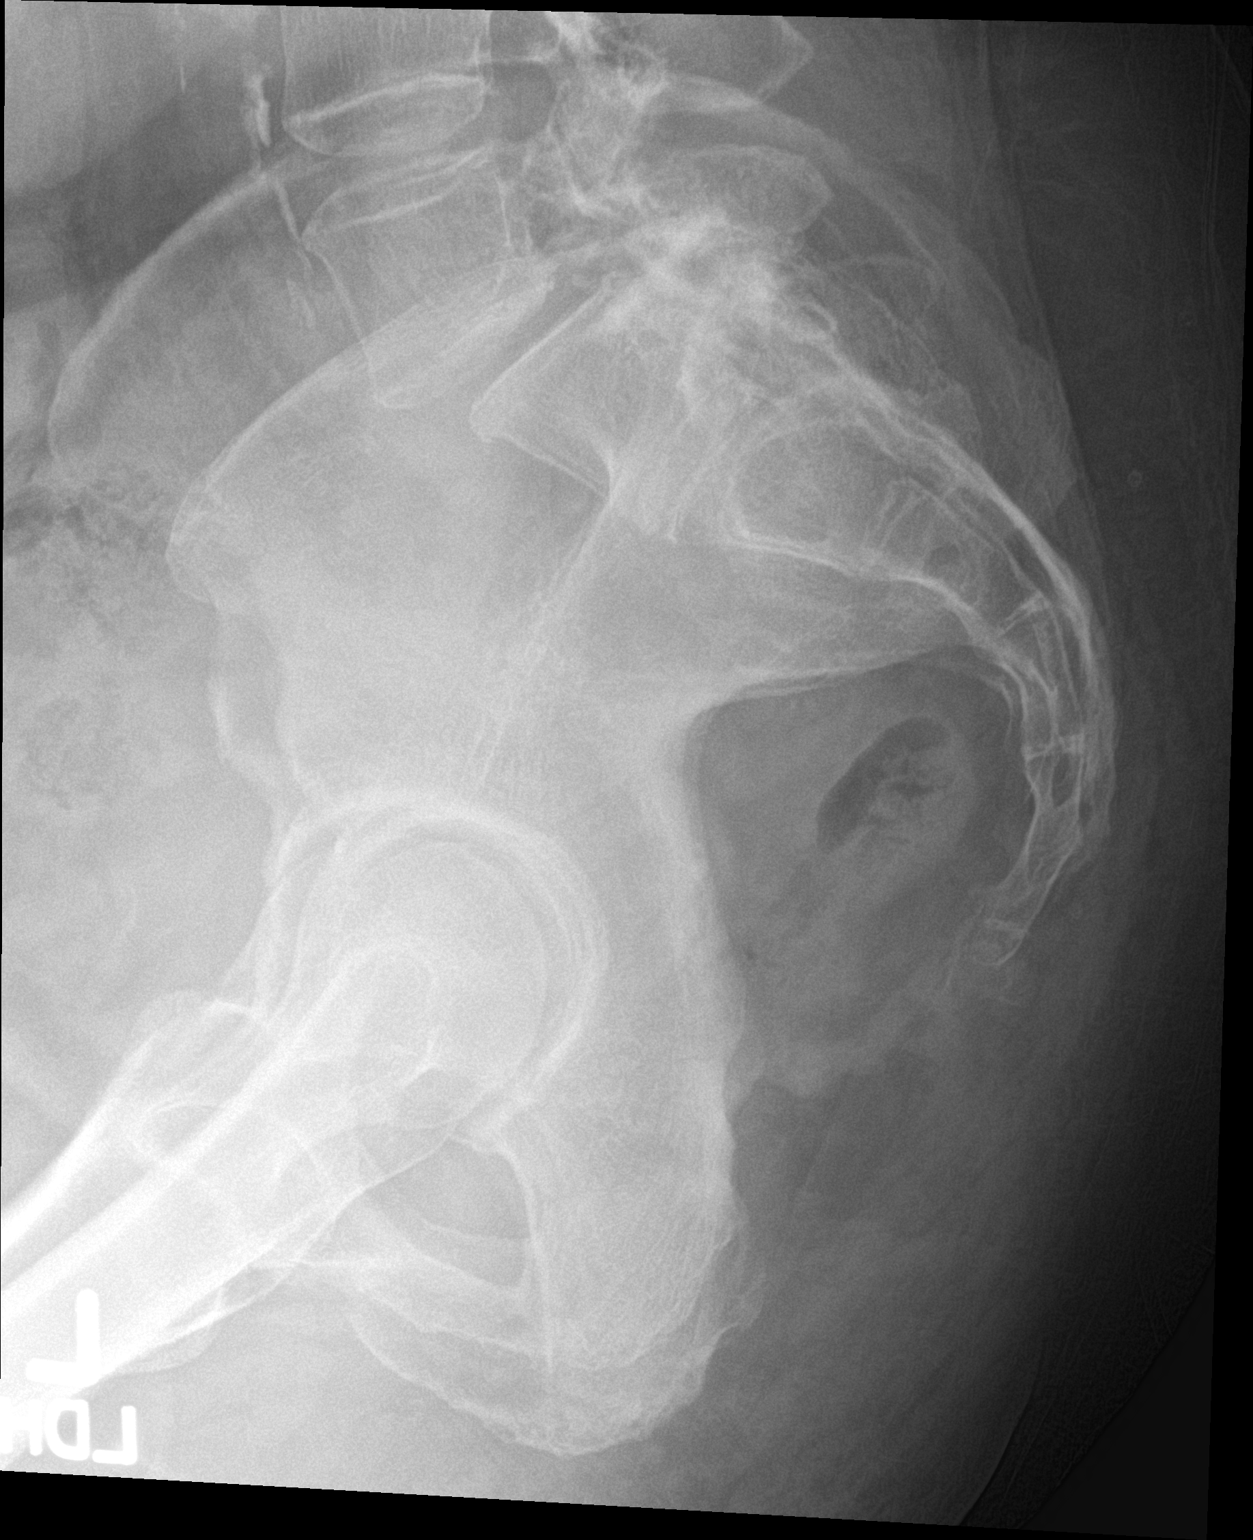

[3 of 3 positions shown; findings below may reference images not displayed]

FINDINGS: Grade 1 anterolisthesis of L5 versus S1 is identified and unchanged.
Degenerative changes are seen in the lower lumbar facets. No other
acute abnormalities identified. Specifically, no fractures to the
sacrum or coccyx are identified.
IMPRESSION: No acute abnormalities.

## 2018-03-27 IMAGING — CR DG LUMBAR SPINE 2-3V
1 series · 3 of 3 positions shown · non-contrast
Comparison: Lumbar spine films of 01/19/2016

CLINICAL DATA: Fell yesterday at work with right-sided pain

EXAM:
LUMBAR SPINE - 2-3 VIEW

[Series 1: dg lumbar spine 2-3 views · 0.14mm/px · 3 of 3 slices shown]
[im 1/3]
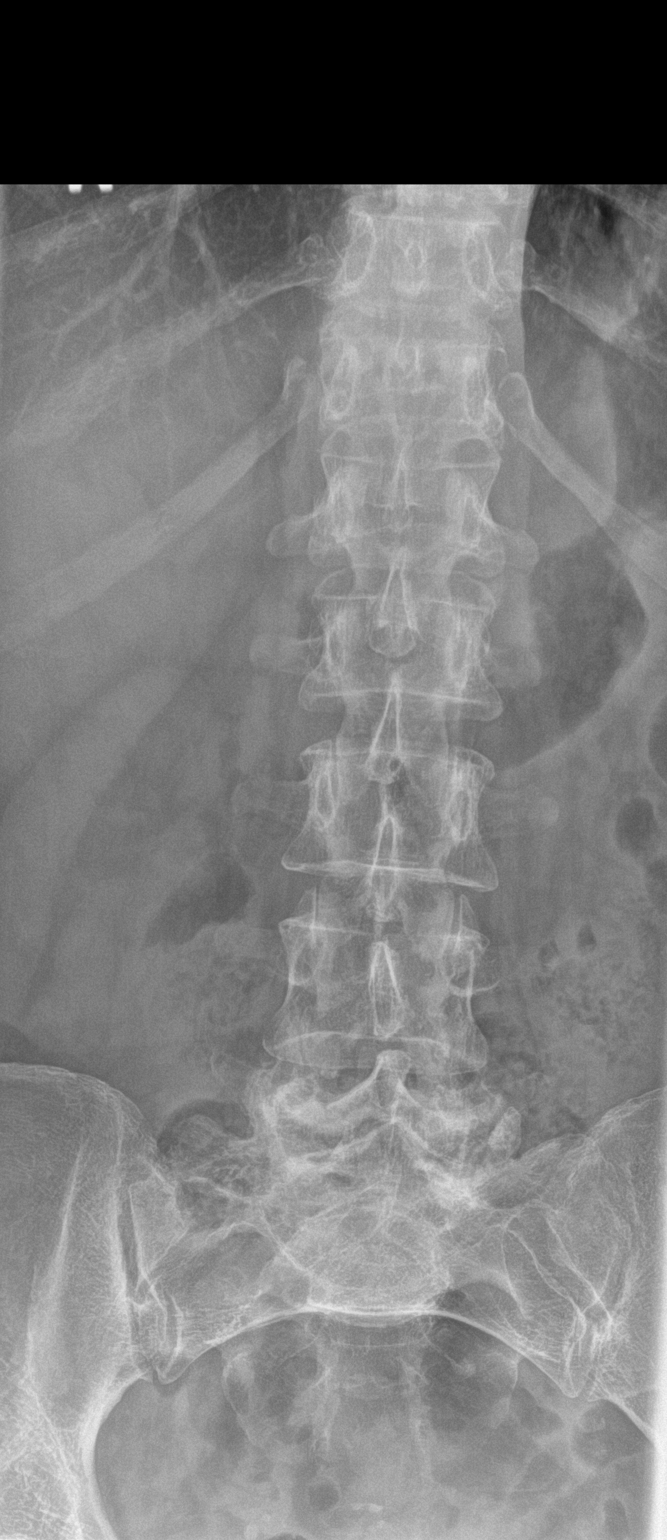
[im 2/3]
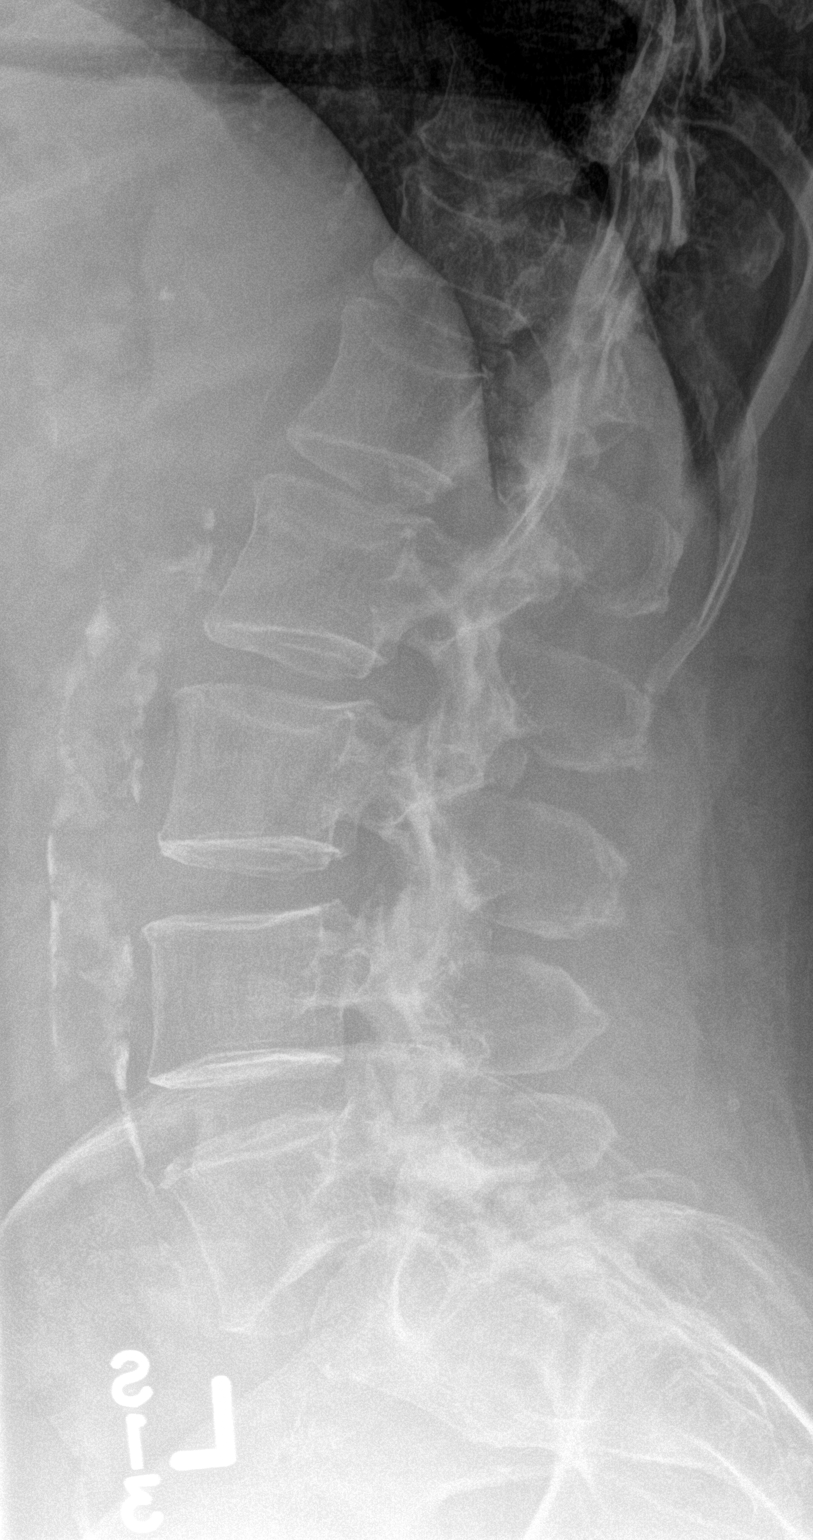
[im 3/3]
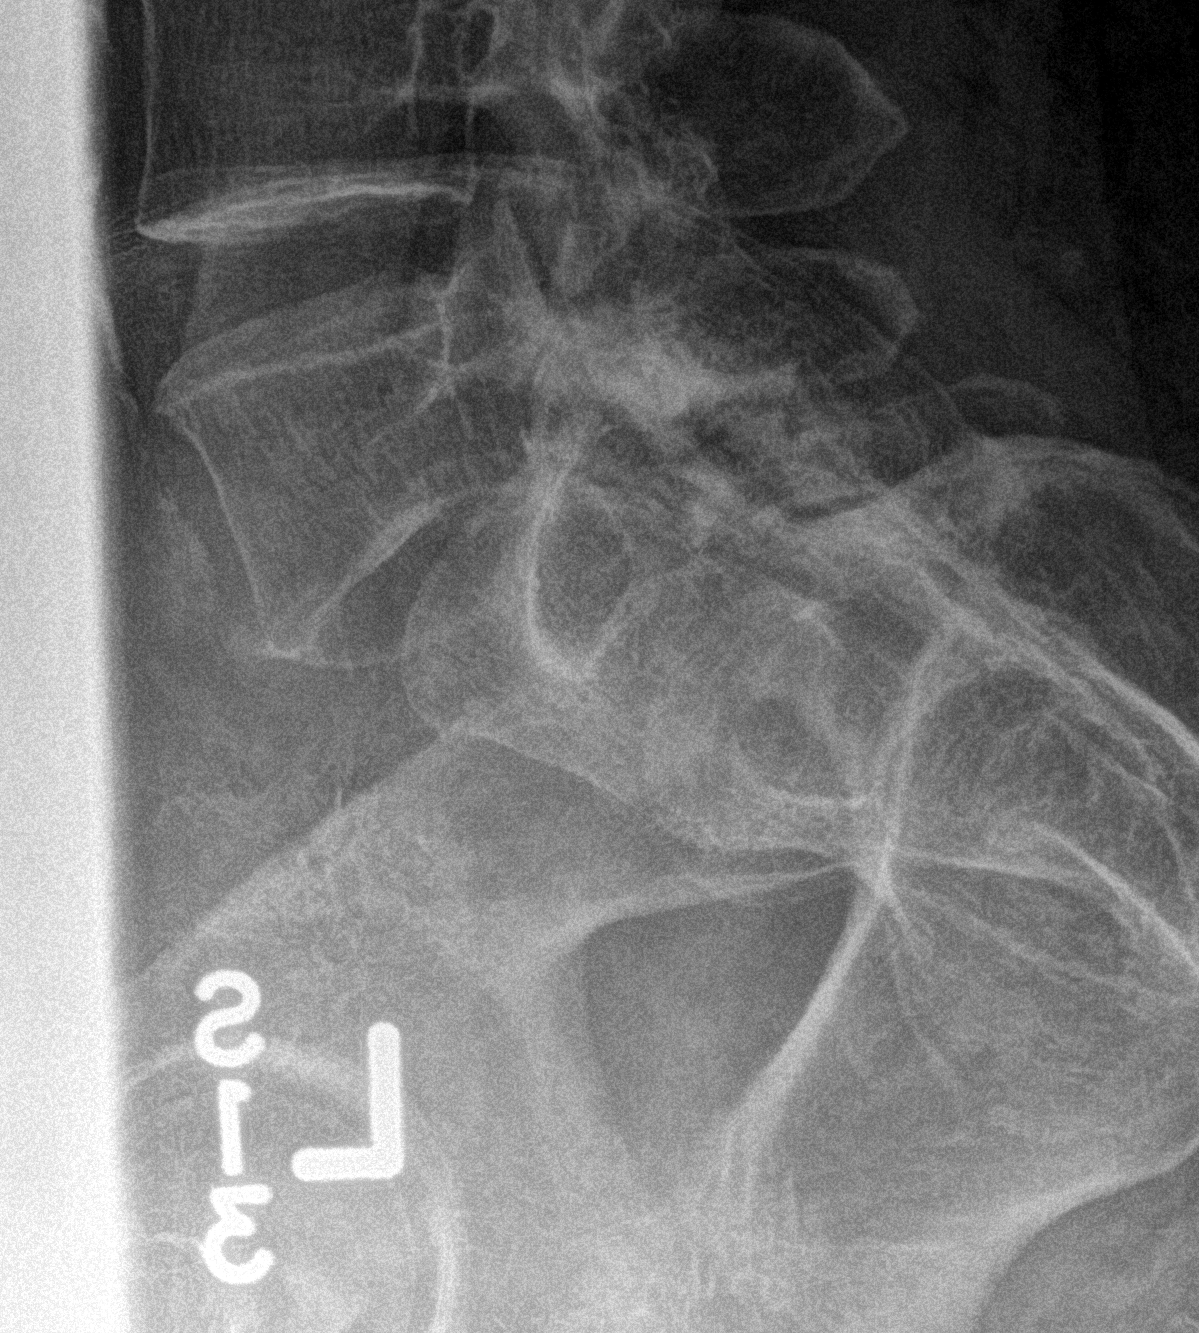

[3 of 3 positions shown; findings below may reference images not displayed]

FINDINGS: The lumbar vertebrae remain in normal alignment. Intervertebral disc
spaces appear normal. No new compression deformity is seen. Moderate
abdominal aortic atherosclerosis is present. The SI joints are
corticated.
IMPRESSION: Normal alignment with no acute abnormality. Moderate abdominal
aortic atherosclerosis.
